# Patient Record
Sex: Male | Born: 1975 | Race: White | Hispanic: No | Marital: Married | Smoking: Never smoker
Health system: Southern US, Community
[De-identification: ages and names within clinical notes are randomized; demographics above are authoritative.]

## PROBLEM LIST (undated history)

## (undated) DIAGNOSIS — J342 Deviated nasal septum: Secondary | ICD-10-CM

## (undated) DIAGNOSIS — L905 Scar conditions and fibrosis of skin: Secondary | ICD-10-CM

## (undated) HISTORY — PX: RHINOPLASTY: SUR1284

## (undated) SURGERY — RECONSTRUCTION, NOSE
Anesthesia: General

---

## 2011-08-23 HISTORY — PX: FACIAL COSMETIC SURGERY: SHX629

## 2015-03-04 ENCOUNTER — Encounter (HOSPITAL_COMMUNITY): Payer: Self-pay | Admitting: Emergency Medicine

## 2015-03-04 ENCOUNTER — Emergency Department (HOSPITAL_COMMUNITY)
Admission: EM | Admit: 2015-03-04 | Discharge: 2015-03-04 | Disposition: A | Discharge status: Home / Self Care | Payer: BLUE CROSS/BLUE SHIELD | Attending: Emergency Medicine | Admitting: Emergency Medicine

## 2015-03-04 DIAGNOSIS — T7809XA Anaphylactic reaction due to other food products, initial encounter: Secondary | ICD-10-CM | POA: Insufficient documentation

## 2015-03-04 DIAGNOSIS — R509 Fever, unspecified: Secondary | ICD-10-CM | POA: Diagnosis not present

## 2015-03-04 DIAGNOSIS — Y998 Other external cause status: Secondary | ICD-10-CM | POA: Insufficient documentation

## 2015-03-04 DIAGNOSIS — X58XXXA Exposure to other specified factors, initial encounter: Secondary | ICD-10-CM | POA: Diagnosis not present

## 2015-03-04 DIAGNOSIS — R21 Rash and other nonspecific skin eruption: Secondary | ICD-10-CM | POA: Diagnosis present

## 2015-03-04 DIAGNOSIS — Y9389 Activity, other specified: Secondary | ICD-10-CM | POA: Diagnosis not present

## 2015-03-04 DIAGNOSIS — Y9289 Other specified places as the place of occurrence of the external cause: Secondary | ICD-10-CM | POA: Diagnosis not present

## 2015-03-04 DIAGNOSIS — T782XXA Anaphylactic shock, unspecified, initial encounter: Secondary | ICD-10-CM

## 2015-03-04 DIAGNOSIS — R197 Diarrhea, unspecified: Secondary | ICD-10-CM

## 2015-03-04 LAB — URINALYSIS, ROUTINE W REFLEX MICROSCOPIC
BILIRUBIN URINE: NEGATIVE
Glucose, UA: NEGATIVE mg/dL
Hgb urine dipstick: NEGATIVE
Ketones, ur: NEGATIVE mg/dL
LEUKOCYTES UA: NEGATIVE
NITRITE: NEGATIVE
PROTEIN: NEGATIVE mg/dL
SPECIFIC GRAVITY, URINE: 1.027 (ref 1.005–1.030)
Urobilinogen, UA: 0.2 mg/dL (ref 0.0–1.0)
pH: 6 (ref 5.0–8.0)

## 2015-03-04 LAB — COMPREHENSIVE METABOLIC PANEL
ALK PHOS: 65 U/L (ref 38–126)
ALT: 29 U/L (ref 17–63)
AST: 31 U/L (ref 15–41)
Albumin: 3.6 g/dL (ref 3.5–5.0)
Anion gap: 11 (ref 5–15)
BUN: 15 mg/dL (ref 6–20)
CO2: 21 mmol/L — ABNORMAL LOW (ref 22–32)
Calcium: 9.1 mg/dL (ref 8.9–10.3)
Chloride: 103 mmol/L (ref 101–111)
Creatinine, Ser: 1.25 mg/dL — ABNORMAL HIGH (ref 0.61–1.24)
GFR calc non Af Amer: >60 mL/min (ref >60)
Glucose, Bld: 188 mg/dL — ABNORMAL HIGH (ref 65–99)
POTASSIUM: 3.5 mmol/L (ref 3.5–5.1)
Sodium: 135 mmol/L (ref 135–145)
Total Bilirubin: 0.6 mg/dL (ref 0.3–1.2)
Total Protein: 7.1 g/dL (ref 6.5–8.1)

## 2015-03-04 LAB — CBC WITH DIFFERENTIAL/PLATELET
BASOS ABS: 0 10*3/uL (ref 0.0–0.1)
Basophils Relative: 0 % (ref 0–1)
EOS ABS: 0 10*3/uL (ref 0.0–0.7)
Eosinophils Relative: 0 % (ref 0–5)
HCT: 42.7 % (ref 39.0–52.0)
Hemoglobin: 15.2 g/dL (ref 13.0–17.0)
LYMPHS ABS: 1.9 10*3/uL (ref 0.7–4.0)
Lymphocytes Relative: 15 % (ref 12–46)
MCH: 29.3 pg (ref 26.0–34.0)
MCHC: 35.6 g/dL (ref 30.0–36.0)
MCV: 82.4 fL (ref 78.0–100.0)
MONO ABS: 0.3 10*3/uL (ref 0.1–1.0)
Monocytes Relative: 3 % (ref 3–12)
Neutro Abs: 10.7 10*3/uL — ABNORMAL HIGH (ref 1.7–7.7)
Neutrophils Relative %: 82 % — ABNORMAL HIGH (ref 43–77)
Platelets: 197 10*3/uL (ref 150–400)
RBC: 5.18 MIL/uL (ref 4.22–5.81)
RDW: 13.1 % (ref 11.5–15.5)
WBC: 12.9 10*3/uL — ABNORMAL HIGH (ref 4.0–10.5)

## 2015-03-04 LAB — I-STAT CG4 LACTIC ACID, ED
LACTIC ACID, VENOUS: 1.06 mmol/L (ref 0.5–2.0)
Lactic Acid, Venous: 3.31 mmol/L (ref 0.5–2.0)

## 2015-03-04 MED ORDER — EPINEPHRINE HCL 0.1 MG/ML IJ SOSY
0.3000 mg | PREFILLED_SYRINGE | Freq: Once | INTRAMUSCULAR | Status: DC
  Filled 2015-03-04: qty 10

## 2015-03-04 MED ORDER — RANITIDINE HCL 150 MG/10ML PO SYRP
150.0000 mg | ORAL_SOLUTION | Freq: Once | ORAL | Status: AC
  Administered 2015-03-04: 150 mg via ORAL
  Filled 2015-03-04: qty 10

## 2015-03-04 MED ORDER — PREDNISONE 20 MG PO TABS
60.0000 mg | ORAL_TABLET | Freq: Once | ORAL | Status: AC
  Administered 2015-03-04: 60 mg via ORAL
  Filled 2015-03-04: qty 3

## 2015-03-04 MED ORDER — EPINEPHRINE 0.3 MG/0.3ML IJ SOAJ: 0.3000 mg | Freq: Once | INTRAMUSCULAR | Status: DC

## 2015-03-04 MED ORDER — HYDROXYZINE HCL 25 MG PO TABS
25.0000 mg | ORAL_TABLET | Freq: Once | ORAL | Status: DC
  Filled 2015-03-04: qty 1

## 2015-03-04 MED ORDER — HYDROXYZINE HCL 25 MG PO TABS: 25.0000 mg | ORAL_TABLET | Freq: Four times a day (QID) | ORAL | Status: DC

## 2015-03-04 MED ORDER — SODIUM CHLORIDE 0.9 % IV BOLUS (SEPSIS)
1000.0000 mL | Freq: Once | INTRAVENOUS | Status: AC
  Administered 2015-03-04: 1000 mL via INTRAVENOUS

## 2015-03-04 MED ORDER — PREDNISONE 10 MG PO TABS: 40.0000 mg | ORAL_TABLET | Freq: Every day | ORAL | Status: AC

## 2015-03-04 MED ORDER — DIPHENHYDRAMINE HCL 50 MG/ML IJ SOLN
50.0000 mg | Freq: Once | INTRAMUSCULAR | Status: AC
  Administered 2015-03-04: 50 mg via INTRAVENOUS
  Filled 2015-03-04: qty 1

## 2015-03-04 MED ORDER — EPINEPHRINE 0.3 MG/0.3ML IJ SOAJ
0.3000 mg | Freq: Once | INTRAMUSCULAR | Status: AC
  Administered 2015-03-04: 0.3 mg via INTRAMUSCULAR
  Filled 2015-03-04: qty 0.3

## 2015-03-04 NOTE — ED Notes (Signed)
MD at bedside. 

## 2015-03-04 NOTE — ED Notes (Signed)
MD and this RN at bedside, starting IV, tech obtaining vs

## 2015-03-04 NOTE — ED Notes (Signed)
Dr. Micheline Mazeocherty notified of lactic acid

## 2015-03-04 NOTE — Discharge Instructions (Signed)
Anaphylactic Reaction °An anaphylactic reaction is a sudden, severe allergic reaction that involves the whole body. It can be life threatening. A hospital stay is often required. People with asthma, eczema, or hay fever are slightly more likely to have an anaphylactic reaction. °CAUSES  °An anaphylactic reaction may be caused by anything to which you are allergic. After being exposed to the allergic substance, your immune system becomes sensitized to it. When you are exposed to that allergic substance again, an allergic reaction can occur. Common causes of an anaphylactic reaction include: °· Medicines. °· Foods, especially peanuts, wheat, shellfish, milk, and eggs. °· Insect bites or stings. °· Blood products. °· Chemicals, such as dyes, latex, and contrast material used for imaging tests. °SYMPTOMS  °When an allergic reaction occurs, the body releases histamine and other substances. These substances cause symptoms such as tightening of the airway. Symptoms often develop within seconds or minutes of exposure. Symptoms may include: °· Skin rash or hives. °· Itching. °· Chest tightness. °· Swelling of the eyes, tongue, or lips. °· Trouble breathing or swallowing. °· Lightheadedness or fainting. °· Anxiety or confusion. °· Stomach pains, vomiting, or diarrhea. °· Nasal congestion. °· A fast or irregular heartbeat (palpitations). °DIAGNOSIS  °Diagnosis is based on your history of recent exposure to allergic substances, your symptoms, and a physical exam. Your caregiver may also perform blood or urine tests to confirm the diagnosis. °TREATMENT  °Epinephrine medicine is the main treatment for an anaphylactic reaction. Other medicines that may be used for treatment include antihistamines, steroids, and albuterol. In severe cases, fluids and medicine to support blood pressure may be given through an intravenous line (IV). Even if you improve after treatment, you need to be observed to make sure your condition does not get  worse. This may require a stay in the hospital. °HOME CARE INSTRUCTIONS  °· Wear a medical alert bracelet or necklace stating your allergy. °· You and your family must learn how to use an anaphylaxis kit or give an epinephrine injection to temporarily treat an emergency allergic reaction. Always carry your epinephrine injection or anaphylaxis kit with you. This can be lifesaving if you have a severe reaction. °· Do not drive or perform tasks after treatment until the medicines used to treat your reaction have worn off, or until your caregiver says it is okay. °· If you have hives or a rash: °· Take medicines as directed by your caregiver. °· You may use an over-the-counter antihistamine (diphenhydramine) as needed. °· Apply cold compresses to the skin or take baths in cool water. Avoid hot baths or showers. °SEEK MEDICAL CARE IF:  °· You develop symptoms of an allergic reaction to a new substance. Symptoms may start right away or minutes later. °· You develop a rash, hives, or itching. °· You develop new symptoms. °SEEK IMMEDIATE MEDICAL CARE IF:  °· You have swelling of the mouth, difficulty breathing, or wheezing. °· You have a tight feeling in your chest or throat. °· You develop hives, swelling, or itching all over your body. °· You develop severe vomiting or diarrhea. °· You feel faint or pass out. °This is an emergency. Use your epinephrine injection or anaphylaxis kit as you have been instructed. Call your local emergency services (911 in [place] Even if you improve after the injection, you need to be examined at a hospital emergency department. °MAKE SURE YOU:  °· Understand these instructions. °· Will watch your condition. °· Will get help right away if you are not   doing well or get worse. °Document Released: 08/08/2005 Document Revised: 08/13/2013 Document Reviewed: 11/09/2011 °ExitCare® Patient Information ©2015 [employer]. This information is not intended to replace advice given to you by your health  care provider. Make sure you discuss any questions you have with your health care provider. ° ° ° ° °Allergies °Allergies may happen from anything your body is sensitive to. This may be food, medicines, pollens, chemicals, and nearly anything around you in everyday life that produces allergens. An allergen is anything that causes an allergy producing substance. Heredity is often a factor in causing these problems. This means you may have some of the same allergies as your parents. °Food allergies happen in all age groups. Food allergies are some of the most severe and life threatening. Some common food allergies are cow's milk, seafood, eggs, nuts, wheat, and soybeans. °SYMPTOMS  °· Swelling around the mouth. °· An itchy red rash or hives. °· Vomiting or diarrhea. °· Difficulty breathing. °SEVERE ALLERGIC REACTIONS ARE LIFE-THREATENING. °This reaction is called anaphylaxis. It can cause the mouth and throat to swell and cause difficulty with breathing and swallowing. In severe reactions only a trace amount of food (for example, peanut oil in a salad) may cause death within seconds. °Seasonal allergies occur in all age groups. These are seasonal because they usually occur during the same season every year. They may be a reaction to molds, grass pollens, or tree pollens. Other causes of problems are house dust mite allergens, pet dander, and mold spores. The symptoms often consist of nasal congestion, a runny itchy nose associated with sneezing, and tearing itchy eyes. There is often an associated itching of the mouth and ears. The problems happen when you come in contact with pollens and other allergens. Allergens are the particles in the air that the body reacts to with an allergic reaction. This causes you to release allergic antibodies. Through a chain of events, these eventually cause you to release histamine into the blood stream. Although it is meant to be protective to the body, it is this release that causes  your discomfort. This is why you were given anti-histamines to feel better.  If you are unable to pinpoint the offending allergen, it may be determined by skin or blood testing. Allergies cannot be cured but can be controlled with medicine. °Hay fever is a collection of all or some of the seasonal allergy problems. It may often be treated with simple over-the-counter medicine such as diphenhydramine. Take medicine as directed. Do not drink alcohol or drive while taking this medicine. Check with your caregiver or package insert for child dosages. °If these medicines are not effective, there are many new medicines your caregiver can prescribe. Stronger medicine such as nasal spray, eye drops, and corticosteroids may be used if the first things you try do not work well. Other treatments such as immunotherapy or desensitizing injections can be used if all else fails. Follow up with your caregiver if problems continue. These seasonal allergies are usually not life threatening. They are generally more of a nuisance that can often be handled using medicine. °HOME CARE INSTRUCTIONS  °· If unsure what causes a reaction, keep a diary of foods eaten and symptoms that follow. Avoid foods that cause reactions. °· If hives or rash are present: °¨ Take medicine as directed. °¨ You may use an over-the-counter antihistamine (diphenhydramine) for hives and itching as needed. °¨ Apply cold compresses (cloths) to the skin or take baths in cool water. Avoid hot baths   or showers. Heat will make a rash and itching worse. °· If you are severely allergic: °¨ Following a treatment for a severe reaction, hospitalization is often required for closer follow-up. °¨ Wear a medic-alert bracelet or necklace stating the allergy. °¨ You and your family must learn how to give adrenaline or use an anaphylaxis kit. °¨ If you have had a severe reaction, always carry your anaphylaxis kit or EpiPen® with you. Use this medicine as directed by your caregiver  if a severe reaction is occurring. Failure to do so could have a fatal outcome. °SEEK MEDICAL CARE IF: °· You suspect a food allergy. Symptoms generally happen within 30 minutes of eating a food. °· Your symptoms have not gone away within 2 days or are getting worse. °· You develop new symptoms. °· You want to retest yourself or your child with a food or drink you think causes an allergic reaction. Never do this if an anaphylactic reaction to that food or drink has happened before. Only do this under the care of a caregiver. °SEEK IMMEDIATE MEDICAL CARE IF:  °· You have difficulty breathing, are wheezing, or have a tight feeling in your chest or throat. °· You have a swollen mouth, or you have hives, swelling, or itching all over your body. °· You have had a severe reaction that has responded to your anaphylaxis kit or an EpiPen®. These reactions may return when the medicine has worn off. These reactions should be considered life threatening. °MAKE SURE YOU:  °· Understand these instructions. °· Will watch your condition. °· Will get help right away if you are not doing well or get worse. °Document Released: 11/01/2002 Document Revised: 12/03/2012 Document Reviewed: 04/07/2008 °ExitCare® Patient Information ©2015 [employer]. This information is not intended to replace advice given to you by your health care provider. Make sure you discuss any questions you have with your health care provider. ° °

## 2015-03-04 NOTE — ED Notes (Signed)
Pt states he is itching more now

## 2015-03-04 NOTE — ED Provider Notes (Signed)
CSN: [id number]     Arrival date & time 03/04/15  [id number] History   First MD Initiated Contact with Patient 03/04/15 [phone]     No chief complaint on file.    (Consider location/radiation/quality/duration/timing/severity/associated sxs/prior Treatment) Patient is a 39 y.o. male presenting with rash.  Rash Location:  Full body Quality: itchiness and redness   Severity:  Severe Onset quality:  Sudden Duration:  3 days Timing:  Constant Progression:  Waxing and waning Chronicity:  New Context: food (potato chips with certain type of oil)   Context: not animal contact, not exposure to similar rash, not insect bite/sting, not medications, not new detergent/soap, not plant contact and not sick contacts   Context comment:  Immunized Relieved by: prednisone and benadryl helped at first from urgent care but symptoms worse today. Associated symptoms: abdominal pain (diffuse and lower), diarrhea, fever (100.6 yesterday) and nausea   Associated symptoms: no headaches, no hoarse voice, no joint pain, no shortness of breath, no sore throat, no throat swelling (felt like it was yesterday, not today) and not vomiting     No past medical history on file. No past surgical history on file. No family history on file. History  Substance Use Topics  . Smoking status: Not on file  . Smokeless tobacco: Not on file  . Alcohol Use: Not on file    Review of Systems  Constitutional: Positive for fever (100.6 yesterday).  HENT: Negative for hoarse voice and sore throat.   Eyes: Negative for visual disturbance.  Respiratory: Negative for shortness of breath.   Cardiovascular: Negative for chest pain.  Gastrointestinal: Positive for nausea, abdominal pain (diffuse and lower) and diarrhea. Negative for vomiting.  Genitourinary: Positive for dysuria. Negative for difficulty urinating.  Musculoskeletal: Negative for back pain, arthralgias and neck stiffness.  Skin: Positive for rash.  Neurological: Negative  for syncope and headaches.      Allergies  Review of patient's allergies indicates not on file.  Home Medications   Prior to Admission medications   Not on File   BP 134/83 mmHg  Pulse 95  Temp(Src) 98.3 F (36.8 C) (Oral)  Resp 18  Ht 5\' 11"  (1.803 m)  Wt 194 lb (87.998 kg)  BMI 27.07 kg/m2  SpO2 100% Physical Exam  Constitutional: He is oriented to person, place, and time. He appears well-developed and well-nourished. No distress.  HENT:  Head: Normocephalic and atraumatic.  Mouth/Throat: Oropharynx is clear and moist. No oropharyngeal exudate (no oral/mucous membrane lesions).  Eyes: Conjunctivae and EOM are normal. Pupils are equal, round, and reactive to light.  Normal conjunctiva   Neck: Normal range of motion.  Cardiovascular: Normal rate, regular rhythm, normal heart sounds and intact distal pulses.  Exam reveals no gallop and no friction rub.   No murmur heard. Pulmonary/Chest: Effort normal and breath sounds normal. No respiratory distress. He has no wheezes. He has no rales.  Abdominal: Soft. He exhibits no distension. There is no tenderness. There is no guarding.  Musculoskeletal: He exhibits no edema.  Neurological: He is alert and oriented to person, place, and time.  Skin: Skin is warm and dry. Rash (diffuse urticaria, various sizes) noted. He is not diaphoretic.  Nursing note and vitals reviewed.   ED Course  Procedures (including critical care time) Labs Review Labs Reviewed  CBC WITH DIFFERENTIAL/PLATELET  COMPREHENSIVE METABOLIC PANEL  URINALYSIS, ROUTINE W REFLEX MICROSCOPIC (NOT AT [facility])  I-STAT CG4 LACTIC ACID, ED    Imaging Review No results found.  EKG Interpretation None      MDM   Final diagnoses:  None   39 year old male with no significant medical history presents with concern of pruritic rash that developed suddenly everywhere after eating bag of potato chips. Patient reports that a rash started on Sunday and he went to  urgent care where he received some type of injection, prednisone and Benadryl. Reports that the rash has been waxing and waning since that time however became worse again today and he developed diarrhea, abdominal pain and nausea. Reports a sensation of throat swelling yesterday that has now resolved however he did not seek care at that time. Rash does not have the appearance of SSS, TEN, erythroderma, scabies, cellulitis or RMSF.  Given description of waxing/waning lesions developing in different locations and sudden appearance of pruritic rash consistent with urticaria.  Given combination of rash with development of GI symptoms, patient was treated for anaphylaxis with 0.3 mg of IM epinephrine, Benadryl, steroid-dependent and Zantac.  He was observed in the emergency department for 6 hours without recurrence of his symptoms.  Patient had great improvement of GI symptoms immediately following epinephrine and improvement in the rash.  Patient had also described a fever yesterday however is afebrile today without receiving anti-pyretics prior to arrival. Other etiologies of patient's symptoms include possible viral syndrome with urticaria.  Given concern for possible anaphylaxis patient will be discharged with an EpiPen as well as prednisone and atarax for pruritis.  Discussed need to look for potential triggers, avoid the potato chips.  Follow up closely with PCP.     Alvira Monday, MD 03/04/15 [phone]

## 2015-03-04 NOTE — ED Notes (Signed)
Hives, itching, rash since July 11th.  Abd pain and diarrea as well since that time, dysuria too. MD at bedside now sob today, no difficulty breathing.  vss

## 2015-07-23 DIAGNOSIS — L905 Scar conditions and fibrosis of skin: Secondary | ICD-10-CM

## 2015-07-23 DIAGNOSIS — J342 Deviated nasal septum: Secondary | ICD-10-CM

## 2015-07-23 HISTORY — DX: Deviated nasal septum: J34.2

## 2015-07-23 HISTORY — DX: Scar conditions and fibrosis of skin: L90.5

## 2015-08-03 ENCOUNTER — Encounter (HOSPITAL_BASED_OUTPATIENT_CLINIC_OR_DEPARTMENT_OTHER): Payer: Self-pay | Admitting: *Deleted

## 2015-08-03 ENCOUNTER — Ambulatory Visit: Payer: Self-pay | Admitting: Specialist

## 2015-08-03 ENCOUNTER — Other Ambulatory Visit: Payer: Self-pay | Admitting: Specialist

## 2015-08-06 ENCOUNTER — Other Ambulatory Visit: Payer: Self-pay | Admitting: Specialist

## 2015-08-06 ENCOUNTER — Ambulatory Visit: Payer: Self-pay | Admitting: Specialist

## 2015-08-06 MED ORDER — DEXTROSE 5 % IV SOLN: 2.0000 g | INTRAVENOUS | Status: AC

## 2015-08-10 ENCOUNTER — Encounter (HOSPITAL_BASED_OUTPATIENT_CLINIC_OR_DEPARTMENT_OTHER)
Admission: RE | Disposition: A | Discharge status: Home / Self Care | Payer: Self-pay | Source: Ambulatory Visit | Attending: Specialist

## 2015-08-10 ENCOUNTER — Ambulatory Visit (HOSPITAL_BASED_OUTPATIENT_CLINIC_OR_DEPARTMENT_OTHER): Admission: RE | Admit: 2015-08-10 | Payer: BLUE CROSS/BLUE SHIELD | Source: Ambulatory Visit | Admitting: Specialist

## 2015-08-10 ENCOUNTER — Encounter (HOSPITAL_BASED_OUTPATIENT_CLINIC_OR_DEPARTMENT_OTHER): Payer: Self-pay | Admitting: *Deleted

## 2015-08-10 ENCOUNTER — Ambulatory Visit (HOSPITAL_BASED_OUTPATIENT_CLINIC_OR_DEPARTMENT_OTHER): Payer: BLUE CROSS/BLUE SHIELD | Admitting: Anesthesiology

## 2015-08-10 ENCOUNTER — Ambulatory Visit (HOSPITAL_BASED_OUTPATIENT_CLINIC_OR_DEPARTMENT_OTHER)
Admission: RE | Admit: 2015-08-10 | Discharge: 2015-08-10 | Disposition: A | Discharge status: Home / Self Care | Payer: BLUE CROSS/BLUE SHIELD | Source: Ambulatory Visit | Attending: Specialist | Admitting: Specialist

## 2015-08-10 DIAGNOSIS — L905 Scar conditions and fibrosis of skin: Secondary | ICD-10-CM | POA: Diagnosis not present

## 2015-08-10 DIAGNOSIS — J342 Deviated nasal septum: Secondary | ICD-10-CM | POA: Diagnosis not present

## 2015-08-10 DIAGNOSIS — J343 Hypertrophy of nasal turbinates: Secondary | ICD-10-CM | POA: Insufficient documentation

## 2015-08-10 HISTORY — DX: Scar conditions and fibrosis of skin: L90.5

## 2015-08-10 HISTORY — DX: Deviated nasal septum: J34.2

## 2015-08-10 HISTORY — PX: SCAR REVISION: SHX5285

## 2015-08-10 HISTORY — PX: SEPTOPLASTY: SHX2393

## 2015-08-10 SURGERY — SEPTOPLASTY, NOSE
Anesthesia: General | Site: Nose | Laterality: Bilateral

## 2015-08-10 SURGERY — RECONSTRUCTION, NOSE
Anesthesia: General

## 2015-08-10 MED ORDER — MEPERIDINE HCL 25 MG/ML IJ SOLN: 6.2500 mg | INTRAMUSCULAR | Status: DC | PRN

## 2015-08-10 MED ORDER — ONDANSETRON 8 MG PO TBDP
8.0000 mg | ORAL_TABLET | Freq: Once | ORAL | Status: AC
  Administered 2015-08-10: 8 mg via ORAL

## 2015-08-10 MED ORDER — DEXAMETHASONE SODIUM PHOSPHATE 4 MG/ML IJ SOLN
INTRAMUSCULAR | Status: DC | PRN
  Administered 2015-08-10: 10 mg via INTRAVENOUS

## 2015-08-10 MED ORDER — MIDAZOLAM HCL 5 MG/5ML IJ SOLN
INTRAMUSCULAR | Status: DC | PRN
  Administered 2015-08-10: 2 mg via INTRAVENOUS

## 2015-08-10 MED ORDER — LIDOCAINE-EPINEPHRINE 1 %-1:100000 IJ SOLN
INTRAMUSCULAR | Status: AC
  Filled 2015-08-10: qty 1

## 2015-08-10 MED ORDER — PROPOFOL 500 MG/50ML IV EMUL
INTRAVENOUS | Status: AC
  Filled 2015-08-10: qty 50

## 2015-08-10 MED ORDER — LACTATED RINGERS IV SOLN
INTRAVENOUS | Status: DC
  Administered 2015-08-10: 07:00:00 via INTRAVENOUS

## 2015-08-10 MED ORDER — OXYCODONE HCL 5 MG PO TABS
ORAL_TABLET | ORAL | Status: AC
  Filled 2015-08-10: qty 1

## 2015-08-10 MED ORDER — BSS IO SOLN
INTRAOCULAR | Status: AC
  Filled 2015-08-10: qty 15

## 2015-08-10 MED ORDER — HYDROMORPHONE HCL 1 MG/ML IJ SOLN
INTRAMUSCULAR | Status: AC
  Filled 2015-08-10: qty 1

## 2015-08-10 MED ORDER — ONDANSETRON 8 MG PO TBDP
ORAL_TABLET | ORAL | Status: AC
  Filled 2015-08-10: qty 1

## 2015-08-10 MED ORDER — FENTANYL CITRATE (PF) 100 MCG/2ML IJ SOLN
INTRAMUSCULAR | Status: DC | PRN
  Administered 2015-08-10: 100 ug via INTRAVENOUS

## 2015-08-10 MED ORDER — CEFAZOLIN SODIUM-DEXTROSE 2-3 GM-% IV SOLR
2.0000 g | INTRAVENOUS | Status: AC
  Administered 2015-08-10: 2 g via INTRAVENOUS

## 2015-08-10 MED ORDER — BACITRACIN ZINC 500 UNIT/GM EX OINT
TOPICAL_OINTMENT | CUTANEOUS | Status: AC
  Filled 2015-08-10: qty 3.6

## 2015-08-10 MED ORDER — SCOPOLAMINE 1 MG/3DAYS TD PT72
1.0000 | MEDICATED_PATCH | Freq: Once | TRANSDERMAL | Status: DC
  Administered 2015-08-10: 1.5 mg via TRANSDERMAL

## 2015-08-10 MED ORDER — COCAINE HCL 4 % EX SOLN
CUTANEOUS | Status: DC | PRN
  Administered 2015-08-10: 4 mL via NASAL

## 2015-08-10 MED ORDER — GLYCOPYRROLATE 0.2 MG/ML IJ SOLN: 0.2000 mg | Freq: Once | INTRAMUSCULAR | Status: DC | PRN

## 2015-08-10 MED ORDER — OXYMETAZOLINE HCL 0.05 % NA SOLN
NASAL | Status: AC
  Filled 2015-08-10: qty 15

## 2015-08-10 MED ORDER — COCAINE HCL 4 % EX SOLN
CUTANEOUS | Status: AC
  Filled 2015-08-10: qty 4

## 2015-08-10 MED ORDER — MIDAZOLAM HCL 2 MG/2ML IJ SOLN
INTRAMUSCULAR | Status: AC
  Filled 2015-08-10: qty 2

## 2015-08-10 MED ORDER — ONDANSETRON HCL 4 MG/2ML IJ SOLN
INTRAMUSCULAR | Status: AC
  Filled 2015-08-10: qty 2

## 2015-08-10 MED ORDER — EPHEDRINE SULFATE 50 MG/ML IJ SOLN
INTRAMUSCULAR | Status: AC
  Filled 2015-08-10: qty 1

## 2015-08-10 MED ORDER — LIDOCAINE HCL (CARDIAC) 20 MG/ML IV SOLN
INTRAVENOUS | Status: AC
  Filled 2015-08-10: qty 5

## 2015-08-10 MED ORDER — OXYCODONE HCL 5 MG PO TABS
5.0000 mg | ORAL_TABLET | Freq: Once | ORAL | Status: AC | PRN
  Administered 2015-08-10: 5 mg via ORAL

## 2015-08-10 MED ORDER — LIDOCAINE-EPINEPHRINE 0.5 %-1:200000 IJ SOLN
INTRAMUSCULAR | Status: AC
  Filled 2015-08-10: qty 2

## 2015-08-10 MED ORDER — SUCCINYLCHOLINE CHLORIDE 20 MG/ML IJ SOLN
INTRAMUSCULAR | Status: DC | PRN
  Administered 2015-08-10: 100 mg via INTRAVENOUS

## 2015-08-10 MED ORDER — PROMETHAZINE HCL 25 MG/ML IJ SOLN: 6.2500 mg | Freq: Once | INTRAMUSCULAR | Status: DC | PRN

## 2015-08-10 MED ORDER — FENTANYL CITRATE (PF) 100 MCG/2ML IJ SOLN
INTRAMUSCULAR | Status: AC
  Filled 2015-08-10: qty 2

## 2015-08-10 MED ORDER — SUCCINYLCHOLINE CHLORIDE 20 MG/ML IJ SOLN
INTRAMUSCULAR | Status: AC
  Filled 2015-08-10: qty 1

## 2015-08-10 MED ORDER — SCOPOLAMINE 1 MG/3DAYS TD PT72
MEDICATED_PATCH | TRANSDERMAL | Status: AC
  Filled 2015-08-10: qty 1

## 2015-08-10 MED ORDER — LIDOCAINE HCL (CARDIAC) 20 MG/ML IV SOLN
INTRAVENOUS | Status: DC | PRN
  Administered 2015-08-10: 50 mg via INTRAVENOUS

## 2015-08-10 MED ORDER — ONDANSETRON HCL 4 MG/2ML IJ SOLN
INTRAMUSCULAR | Status: DC | PRN
  Administered 2015-08-10: 4 mg via INTRAVENOUS

## 2015-08-10 MED ORDER — BACITRACIN-NEOMYCIN-POLYMYXIN 400-5-5000 EX OINT
TOPICAL_OINTMENT | CUTANEOUS | Status: AC
  Filled 2015-08-10: qty 1

## 2015-08-10 MED ORDER — DEXAMETHASONE SODIUM PHOSPHATE 10 MG/ML IJ SOLN
INTRAMUSCULAR | Status: AC
  Filled 2015-08-10: qty 1

## 2015-08-10 MED ORDER — HYDROMORPHONE HCL 1 MG/ML IJ SOLN
0.2500 mg | INTRAMUSCULAR | Status: DC | PRN
  Administered 2015-08-10: 0.25 mg via INTRAVENOUS
  Administered 2015-08-10 (×2): 0.5 mg via INTRAVENOUS

## 2015-08-10 MED ORDER — OXYCODONE HCL 5 MG/5ML PO SOLN: 5.0000 mg | Freq: Once | ORAL | Status: AC | PRN

## 2015-08-10 MED ORDER — LIDOCAINE-EPINEPHRINE 1 %-1:100000 IJ SOLN
INTRAMUSCULAR | Status: DC | PRN
  Administered 2015-08-10: 19 mL

## 2015-08-10 MED ORDER — PROPOFOL 10 MG/ML IV BOLUS
INTRAVENOUS | Status: DC | PRN
  Administered 2015-08-10: 300 mg via INTRAVENOUS

## 2015-08-10 MED ORDER — EPHEDRINE SULFATE 50 MG/ML IJ SOLN
INTRAMUSCULAR | Status: DC | PRN
  Administered 2015-08-10: 10 mg via INTRAVENOUS

## 2015-08-10 MED ORDER — CEFAZOLIN SODIUM-DEXTROSE 2-3 GM-% IV SOLR
INTRAVENOUS | Status: AC
  Filled 2015-08-10: qty 50

## 2015-08-10 SURGICAL SUPPLY — 72 items
BAG DECANTER FOR FLEXI CONT (MISCELLANEOUS) IMPLANT
BANDAGE ELASTIC 4 VELCRO ST LF (GAUZE/BANDAGES/DRESSINGS) IMPLANT
BANDAGE EYE OVAL (MISCELLANEOUS) ×3 IMPLANT
BENZOIN TINCTURE PRP APPL 2/3 (GAUZE/BANDAGES/DRESSINGS) ×3 IMPLANT
BLADE KNIFE PERSONA 15 (BLADE) ×3 IMPLANT
BNDG COHESIVE 4X5 TAN STRL (GAUZE/BANDAGES/DRESSINGS) IMPLANT
BNDG GAUZE ELAST 4 BULKY (GAUZE/BANDAGES/DRESSINGS) IMPLANT
CANISTER SUCT 1200ML W/VALVE (MISCELLANEOUS) ×3 IMPLANT
COAGULATOR SUCT 8FR VV (MISCELLANEOUS) IMPLANT
COVER BACK TABLE 60X90IN (DRAPES) ×3 IMPLANT
COVER MAYO STAND STRL (DRAPES) ×3 IMPLANT
DECANTER SPIKE VIAL GLASS SM (MISCELLANEOUS) ×3 IMPLANT
DRAPE LAPAROTOMY 100X72 PEDS (DRAPES) IMPLANT
DRAPE LAPAROTOMY TRNSV 102X78 (DRAPE) IMPLANT
DRAPE U-SHAPE 76X120 STRL (DRAPES) ×3 IMPLANT
DRSG PAD ABDOMINAL 8X10 ST (GAUZE/BANDAGES/DRESSINGS) IMPLANT
ELECT NEEDLE TIP 2.8 STRL (NEEDLE) ×3 IMPLANT
ELECT REM PT RETURN 9FT ADLT (ELECTROSURGICAL) ×3
ELECTRODE REM PT RTRN 9FT ADLT (ELECTROSURGICAL) ×1 IMPLANT
FILTER 7/8 IN (FILTER) IMPLANT
GAUZE PACKING 1/2X5YD (GAUZE/BANDAGES/DRESSINGS) ×3 IMPLANT
GAUZE SPONGE 4X4 12PLY STRL (GAUZE/BANDAGES/DRESSINGS) IMPLANT
GAUZE SPONGE 4X4 16PLY XRAY LF (GAUZE/BANDAGES/DRESSINGS) IMPLANT
GAUZE VASELINE FOILPK 1/2 X 72 (GAUZE/BANDAGES/DRESSINGS) ×3 IMPLANT
GAUZE XEROFORM 1X8 LF (GAUZE/BANDAGES/DRESSINGS) IMPLANT
GAUZE XEROFORM 5X9 LF (GAUZE/BANDAGES/DRESSINGS) IMPLANT
GLOVE BIO SURGEON STRL SZ 6.5 (GLOVE) ×4 IMPLANT
GLOVE BIO SURGEONS STRL SZ 6.5 (GLOVE) ×2
GLOVE BIOGEL M STRL SZ7.5 (GLOVE) IMPLANT
GLOVE BIOGEL PI IND STRL 8 (GLOVE) IMPLANT
GLOVE BIOGEL PI INDICATOR 8 (GLOVE)
GLOVE ECLIPSE 7.0 STRL STRAW (GLOVE) ×3 IMPLANT
GOWN STRL REUS W/ TWL LRG LVL3 (GOWN DISPOSABLE) IMPLANT
GOWN STRL REUS W/ TWL XL LVL3 (GOWN DISPOSABLE) ×1 IMPLANT
GOWN STRL REUS W/TWL LRG LVL3 (GOWN DISPOSABLE)
GOWN STRL REUS W/TWL XL LVL3 (GOWN DISPOSABLE) ×2
IV NS 500ML (IV SOLUTION) ×2
IV NS 500ML BAXH (IV SOLUTION) ×1 IMPLANT
MARKER SKIN DUAL TIP RULER LAB (MISCELLANEOUS) IMPLANT
NEEDLE HYPO 25X1 1.5 SAFETY (NEEDLE) ×3 IMPLANT
NEEDLE SPNL 18GX3.5 QUINCKE PK (NEEDLE) IMPLANT
PACK BASIN DAY SURGERY FS (CUSTOM PROCEDURE TRAY) ×3 IMPLANT
PACK ENT DAY SURGERY (CUSTOM PROCEDURE TRAY) ×3 IMPLANT
PEN SKIN MARKING BROAD TIP (MISCELLANEOUS) ×3 IMPLANT
SHEET MEDIUM DRAPE 40X70 STRL (DRAPES) IMPLANT
SHEETING SILICONE GEL EPI DERM (MISCELLANEOUS) IMPLANT
SLEEVE SCD COMPRESS KNEE MED (MISCELLANEOUS) ×3 IMPLANT
SPONGE LAP 18X18 X RAY DECT (DISPOSABLE) ×3 IMPLANT
STOCKINETTE IMPERVIOUS LG (DRAPES) IMPLANT
STRIP SUTURE WOUND CLOSURE 1/2 (SUTURE) IMPLANT
SUT ETHILON 5 0 PS 2 18 (SUTURE) ×3 IMPLANT
SUT FIBERWIRE 3-0 18 DIAM 3/8 (SUTURE)
SUT MNCRL AB 3-0 PS2 18 (SUTURE) ×3 IMPLANT
SUT MON AB 2-0 CT1 36 (SUTURE) IMPLANT
SUT MON AB 4-0 PC3 18 (SUTURE) IMPLANT
SUT MON AB 5-0 PS2 18 (SUTURE) ×3 IMPLANT
SUT PROLENE 4 0 PS 2 18 (SUTURE) IMPLANT
SUT PROLENE 5 0 P 3 (SUTURE) IMPLANT
SUT PROLENE 5 0 PS 2 (SUTURE) IMPLANT
SUT PROLENE 6 0 P 1 18 (SUTURE) IMPLANT
SUTURE FIBERWR 3-0 18 DIAM 3/8 (SUTURE) IMPLANT
SYR 20CC LL (SYRINGE) IMPLANT
SYR 50ML LL SCALE MARK (SYRINGE) IMPLANT
SYR BULB IRRIGATION 50ML (SYRINGE) IMPLANT
SYR CONTROL 10ML LL (SYRINGE) IMPLANT
TAPE PAPER 1X10 WHT MICROPORE (GAUZE/BANDAGES/DRESSINGS) IMPLANT
TOWEL OR 17X24 6PK STRL BLUE (TOWEL DISPOSABLE) ×6 IMPLANT
TUBE CONNECTING 20'X1/4 (TUBING)
TUBE CONNECTING 20X1/4 (TUBING) IMPLANT
UNDERPAD 30X30 (UNDERPADS AND DIAPERS) ×3 IMPLANT
VAC PENCILS W/TUBING CLEAR (MISCELLANEOUS) ×3 IMPLANT
YANKAUER SUCT BULB TIP NO VENT (SUCTIONS) IMPLANT

## 2015-08-10 NOTE — Transfer of Care (Signed)
Immediate Anesthesia Transfer of Care Note  Patient: Kejuan Hogenson  Procedure(s) Performed: Procedure(s): RECONSTRUCTION RHINO SEPTOPLASTY CARTILAGE GRAFT REVISED BILATERAL INFERIOR TURBINaTES SCAR REVISION LIP (Bilateral) SCAR REVISION LIP (Bilateral)  Patient Location: PACU  Anesthesia Type:General  Level of Consciousness: awake and sedated  Airway & Oxygen Therapy: Patient Spontanous Breathing and Patient connected to face mask oxygen  Post-op Assessment: Report given to RN and Post -op Vital signs reviewed and stable  Post vital signs: Reviewed and stable  Last Vitals:  Filed Vitals:   08/10/15 0642  BP: 116/86  Pulse: 63  Temp: 36.1 C  Resp: 20    Complications: No apparent anesthesia complications

## 2015-08-10 NOTE — Anesthesia Preprocedure Evaluation (Signed)

## 2015-08-10 NOTE — Brief Op Note (Signed)
08/10/2015  9:09 AM  PATIENT:  Deaire Brunette  39 y.o. male  PRE-OPERATIVE DIAGNOSIS:  DEVIATED SEPTUM  POST-OPERATIVE DIAGNOSIS:  DEVIATED SEPTUM  PROCEDURE:  Procedure(s): RECONSTRUCTION RHINO SEPTOPLASTY CARTILAGE GRAFT REVISED BILATERAL INFERIOR TURBINaTES SCAR REVISION LIP (Bilateral) SCAR REVISION LIP (Bilateral)  SURGEON:  Surgeon(s) and Role:    * Louisa SecondGerald Icyss Skog, MD - Primary  PHYSICIAN ASSISTANT:   ASSISTANTS: none   ANESTHESIA:   general  EBL:  Total I/O In: 1500 [I.V.:1500] Out: -   BLOOD ADMINISTERED:none  DRAINS: none   LOCAL MEDICATIONS USED:  LIDOCAINE   SPECIMEN:  Excision  DISPOSITION OF SPECIMEN:  PATHOLOGY  COUNTS:  YES  TOURNIQUET:  * No tourniquets in log *  DICTATION: .Other Dictation: Dictation Number [id number]  PLAN OF CARE: Discharge to home after PACU  PATIENT DISPOSITION:  PACU - hemodynamically stable.   Delay start of Pharmacological VTE agent (>24hrs) due to surgical blood loss or risk of bleeding: yes

## 2015-08-10 NOTE — Anesthesia Procedure Notes (Signed)
Procedure Name: Intubation Performed by: York GricePEARSON, Anthony Meinders W Pre-anesthesia Checklist: Patient identified, Emergency Drugs available, Suction available and Patient being monitored Patient Re-evaluated:Patient Re-evaluated prior to inductionOxygen Delivery Method: Circle System Utilized Preoxygenation: Pre-oxygenation with 100% oxygen Intubation Type: IV induction Ventilation: Mask ventilation without difficulty Laryngoscope Size: Miller and 2 Grade View: Grade I Tube type: Oral Number of attempts: 1 Airway Equipment and Method: Stylet and Oral airway Placement Confirmation: ETT inserted through vocal cords under direct vision,  positive ETCO2 and breath sounds checked- equal and bilateral Secured at: 22 cm Tube secured with: Tape Dental Injury: Teeth and Oropharynx as per pre-operative assessment

## 2015-08-10 NOTE — Discharge Instructions (Signed)
Activity (include date of return to work if known) °As tolerated: NO showers °NO driving °No heavy activities ° °Diet:regular No restrictions: ° °Wound Care: Keep dressing clean & dry ° °Do not change dressings °For Abdominoplasties wear abdominal binder °Special Instructions: °Do not raise arms up °Continue to empty, recharge, & record drainage from J-P drains &/or °Hemovacs 2-3 times a day, as needed. °Call Doctor if any unusual problems occur such as pain, excessive °Bleeding, unrelieved Nausea/vomiting, Fever &/or chills °When lying down, keep head elevated on 2-3 pillows or back-rest °For Addominoplasties the Jack-knife position °Follow-up appointment: Please call the office. ° °The patient received discharge instruction from:___________________________________________ ° ° °Patient signature ________________________________________ / Date___________ ° ° ° °Signature of individual providing instructions/ Date________________     ° ° °Post Anesthesia Home Care Instructions ° °Activity: °Get plenty of rest for the remainder of the day. A responsible adult should stay with you for 24 hours following the procedure.  °For the next 24 hours, DO NOT: °-Drive a car °-Operate machinery °-Drink alcoholic beverages °-Take any medication unless instructed by your physician °-Make any legal decisions or sign important papers. ° °Meals: °Start with liquid foods such as gelatin or soup. Progress to regular foods as tolerated. Avoid greasy, spicy, heavy foods. If nausea and/or vomiting occur, drink only clear liquids until the nausea and/or vomiting subsides. Call your physician if vomiting continues. ° °Special Instructions/Symptoms: °Your throat may feel dry or sore from the anesthesia or the breathing tube placed in your throat during surgery. If this causes discomfort, gargle with warm salt water. The discomfort should disappear within 24 hours. ° °If you had a scopolamine patch placed behind your ear for the management of  post- operative nausea and/or vomiting: ° °1. The medication in the patch is effective for 72 hours, after which it should be removed.  Wrap patch in a tissue and discard in the trash. Wash hands thoroughly with soap and water. °2. You may remove the patch earlier than 72 hours if you experience unpleasant side effects which may include dry mouth, dizziness or visual disturbances. °3. Avoid touching the patch. Wash your hands with soap and water after contact with the patch. °  °         °

## 2015-08-10 NOTE — Anesthesia Postprocedure Evaluation (Signed)
Anesthesia Post Note  Patient: Anthony Klein  Procedure(s) Performed: Procedure(s) (LRB): RECONSTRUCTION RHINO SEPTOPLASTY CARTILAGE GRAFT REVISED BILATERAL INFERIOR TURBINaTES SCAR REVISION LIP (Bilateral) SCAR REVISION LIP (Bilateral)  Patient location during evaluation: PACU Anesthesia Type: General Level of consciousness: awake and alert Pain management: pain level controlled Vital Signs Assessment: post-procedure vital signs reviewed and stable Respiratory status: spontaneous breathing, nonlabored ventilation, respiratory function stable and patient connected to face mask oxygen Cardiovascular status: blood pressure returned to baseline and stable Postop Assessment: no signs of nausea or vomiting Anesthetic complications: no    Last Vitals:  Filed Vitals:   08/10/15 0642 08/10/15 0915  BP: 116/86 122/72  Pulse: 63 76  Temp: 36.1 C 36.3 C  Resp: 20 16    Last Pain: There were no vitals filed for this visit.               Remus Hagedorn A

## 2015-08-10 NOTE — H&P (Signed)
Anthony Klein is an 39 y.o. male.   Chief Complaint: airway obstruction and nasal deformity HPI: Prev ious injury and seotal deviatio and scarring from an accident  Airway obstruction and septal deviation and lack of lower latera cartilage  Obstructing airways bilaterally  Past Medical History  Diagnosis Date  . 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 Scar of lip 07/2015    Past Surgical History  Procedure Laterality Date  . Rhinoplasty  201  . Facial cosmetic surgery  2013    laser facial surgery, per pt.    History reviewed. No pertinent family history. Social History:  reports that he has never smoked. He has never used smokeless tobacco. He reports that he drinks alcohol. He reports that he does not use illicit drugs.  Allergies: No Known Allergies  Medications Prior to Admission  Medication Sig Dispense Refill  . Multiple Vitamin (MULTIVITAMIN) tablet Take 1 tablet by mouth daily.    . Omega-3 Fatty Acids (FISH OIL PO) Take by mouth.    . EPINEPHrine 0.3 mg/0.3 mL IJ SOAJ injection Inject 0.3 mLs (0.3 mg total) into the muscle once. 1 Device 1    No results found for this or any previous visit (from the past 48 hour(s)). No results found.  Review of Systems  Constitutional: Negative.   HENT: Negative.   Eyes: Negative.   Respiratory: Negative.   Cardiovascular: Negative.   Gastrointestinal: Negative.   Genitourinary: Negative.   Musculoskeletal: Negative.   Skin: Negative.   Neurological: Negative.   Psychiatric/Behavioral: Negative.     Blood pressure 116/86, pulse 63, temperature 97 F (36.1 C), temperature source Oral, resp. rate 20, height 5\' 11"  (1.803 m), weight 90.719 kg (200 lb), SpO2 100 %. Physical Exam   Assessment/Plan Nasal septal deviation and nasal degformities for rhinoseptoplasty and reconstructions  Jaretssi Kraker L 08/10/2015, 7:40 AM

## 2015-08-11 ENCOUNTER — Encounter (HOSPITAL_BASED_OUTPATIENT_CLINIC_OR_DEPARTMENT_OTHER): Payer: Self-pay | Admitting: Specialist

## 2015-08-11 NOTE — Op Note (Deleted)
Anthony Klein, Anthony Klein             ACCOUNT NO.:  [id number]  MEDICAL RECORD NO.:  [id number]  LOCATION:                               FACILITY:  [facility]  PHYSICIAN:  Earvin Hansen L. Shon Hough, Anthony KleinDATE OF BIRTH:  Jan 14, 1976  DATE OF PROCEDURE:  08/10/2015 DATE OF DISCHARGE:  08/10/2015                              OPERATIVE REPORT   A 39 year old gentleman, with previous severe trauma to his nose in the past.  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  He has indentations of the upper lateral cartilages causing when he __________ well has collapse of the areas, hypertrophy of the right and left inferior turbinates.  PROCEDURES PLANNED:  Rhino septoplasty with reconstruction of the indentations of the upper lateral cartilage area using cartilage grafts from the right postauricular region.  ANESTHESIA:  General.  DESCRIPTION OF PROCEDURE:  Preoperatively, the patient went drawings of the nose midline, demarcations of the upper and lower lateral cartilages and defects of the areas.  He underwent general anesthesia, intubated orally.  Prep was done to the facial and neck areas with Betadine soap and solution, walled off with sterile towels and drapes so as to make a sterile field.  After protected with Lacri-Lube intake, 1% Xylocaine with epinephrine injected locally throughout the area 100,000 concentration total of 20 mL, 4% cocaine packs were also placed, a total of 2 mL.  This was allowed to set up.  Next, injection was made on the scar cicatrix involving the right upper lip area, that is going to be excised and revised.  Incisions were made in the intercurrent lattice incisions of the right and left lower cartilage regions, defects of the cartilage.  I was able to remove some cartilage from the area to improve nasal contour and tip.  Next, dissection was carried out to the dorsum of the nose and some excess hump was removed using the rasp.   Excision carried out to the distal septal mucosa.  The septal mucosa was opened, down to the mucoperichondrium.  Excess cartilage was deviated from right to left, was then removed, using a swivel knife to improve the airway tremendously.  Reduction of the right and left inferior turbinates were done using the turbinectomy scissors.  Hemostasis maintained with the Bovie anticoagulation.  Next, on the right postauricular area, incision was made in the fold, carried down through skin, subcutaneous tissue to the underlying cartilage.  It was dissected out and using a large elliptical excision of the carotid protecting the anterior mucosa and skin.  They were to remove that from the area.  Using the morselizer, I was able to contour the cartilage and flatten it out and then we made a contour to __________ of the defects of the right and left lower lateral cartilage regions, which measured approximately 1 x 1.5 cm.  This then fit nicely into the pocket made previously and then was stationed with a 4-0 Monocryl suture due to mucosa.  Same was carried on both sides to improve in the areas, preventing collapse.  The mucosa was then reclosed.  Transfixion stitch of 4-0 Monocryl placed.  Nasal packs were applied to the areas using Vaseline gauze.  Next attention was  drawn to the scar cicatrix involving the right upper lip region, excised with #15 blade.  Dissection carried out laterally.  Subcutaneous closure done with a 5-0 and 6-0 Monocryl subcutaneously x2 levels and a running subcuticular stitch of 5-0 Monocryl.  Next a postauricular areas also closed with a running 4-0 Prolene sutures.  The nasal splint __________ dressings and proper Steri-Strips and gauze applied to all areas withstood the procedure very well.  ESTIMATED BLOOD LOSS:  Less than 100 mL.  COMPLICATIONS:  None.     Anthony Klein, M.D.   ______________________________ Anthony Klein, M.D.    Cathie HoopsGLT/MEDQ  D:   08/10/2015  T:  08/10/2015  Job:  [id number]

## 2015-08-11 NOTE — Op Note (Signed)
NAME:  Anthony Klein, Anthony Klein             ACCOUNT NO.:  [id number]  MEDICAL RECORD NO.:  [id number]  LOCATION:                               FACILITY:  [facility]  PHYSICIAN:  Noele Icenhour L. Erastus Bartolomei, M.D.DATE OF BIRTH:  02/23/1976  DATE OF PROCEDURE:  08/10/2015 DATE OF DISCHARGE:  08/10/2015                              OPERATIVE REPORT   A 39-year-old gentleman, with previous severe trauma to his nose in the past.  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  He has indentations of the upper lateral cartilages causing when he __________ well has collapse of the areas, hypertrophy of the right and left inferior turbinates.  PROCEDURES PLANNED:  Rhino septoplasty with reconstruction of the indentations of the upper lateral cartilage area using cartilage grafts from the right postauricular region.  ANESTHESIA:  General.  DESCRIPTION OF PROCEDURE:  Preoperatively, the patient went drawings of the nose midline, demarcations of the upper and lower lateral cartilages and defects of the areas.  He underwent general anesthesia, intubated orally.  Prep was done to the facial and neck areas with Betadine soap and solution, walled off with sterile towels and drapes so as to make a sterile field.  After protected with Lacri-Lube intake, 1% Xylocaine with epinephrine injected locally throughout the area 100,000 concentration total of 20 mL, 4% cocaine packs were also placed, a total of 2 mL.  This was allowed to set up.  Next, injection was made on the scar cicatrix involving the right upper lip area, that is going to be excised and revised.  Incisions were made in the intercurrent lattice incisions of the right and left lower cartilage regions, defects of the cartilage.  I was able to remove some cartilage from the area to improve nasal contour and tip.  Next, dissection was carried out to the dorsum of the nose and some excess hump was removed using the rasp.   Excision carried out to the distal septal mucosa.  The septal mucosa was opened, down to the mucoperichondrium.  Excess cartilage was deviated from right to left, was then removed, using a swivel knife to improve the airway tremendously.  Reduction of the right and left inferior turbinates were done using the turbinectomy scissors.  Hemostasis maintained with the Bovie anticoagulation.  Next, on the right postauricular area, incision was made in the fold, carried down through skin, subcutaneous tissue to the underlying cartilage.  It was dissected out and using a large elliptical excision of the carotid protecting the anterior mucosa and skin.  They were to remove that from the area.  Using the morselizer, I was able to contour the cartilage and flatten it out and then we made a contour to __________ of the defects of the right and left lower lateral cartilage regions, which measured approximately 1 x 1.5 cm.  This then fit nicely into the pocket made previously and then was stationed with a 4-0 Monocryl suture due to mucosa.  Same was carried on both sides to improve in the areas, preventing collapse.  The mucosa was then reclosed.  Transfixion stitch of 4-0 Monocryl placed.  Nasal packs were applied to the areas using Vaseline gauze.  Next attention was   drawn to the scar cicatrix involving the right upper lip region, excised with #15 blade.  Dissection carried out laterally.  Subcutaneous closure done with a 5-0 and 6-0 Monocryl subcutaneously x2 levels and a running subcuticular stitch of 5-0 Monocryl.  Next a postauricular areas also closed with a running 4-0 Prolene sutures.  The nasal splint __________ dressings and proper Steri-Strips and gauze applied to all areas withstood the procedure very well.  ESTIMATED BLOOD LOSS:  Less than 100 mL.  COMPLICATIONS:  None.     Yaakov GuthrieGerald L. Shon Houghruesdale, M.D.   ______________________________ Yaakov GuthrieGerald L. Shon Houghruesdale, M.D.    Cathie HoopsGLT/MEDQ  D:   08/10/2015  T:  08/10/2015  Job:  [id number]

## 2016-04-27 ENCOUNTER — Other Ambulatory Visit: Payer: Self-pay | Admitting: Family Medicine

## 2016-04-27 DIAGNOSIS — R1011 Right upper quadrant pain: Secondary | ICD-10-CM

## 2016-05-09 ENCOUNTER — Ambulatory Visit
Admission: RE | Admit: 2016-05-09 | Discharge: 2016-05-09 | Disposition: A | Discharge status: Home / Self Care | Payer: BLUE CROSS/BLUE SHIELD | Source: Ambulatory Visit | Attending: Family Medicine | Admitting: Family Medicine

## 2016-05-09 DIAGNOSIS — R1011 Right upper quadrant pain: Secondary | ICD-10-CM

## 2016-05-16 ENCOUNTER — Other Ambulatory Visit: Payer: BLUE CROSS/BLUE SHIELD

## 2017-01-24 ENCOUNTER — Encounter (HOSPITAL_COMMUNITY): Payer: Self-pay | Admitting: Emergency Medicine

## 2017-01-24 ENCOUNTER — Emergency Department (HOSPITAL_COMMUNITY)
Admission: EM | Admit: 2017-01-24 | Discharge: 2017-01-24 | Disposition: A | Discharge status: Home / Self Care | Payer: BLUE CROSS/BLUE SHIELD | Attending: Emergency Medicine | Admitting: Emergency Medicine

## 2017-01-24 DIAGNOSIS — R1084 Generalized abdominal pain: Secondary | ICD-10-CM | POA: Insufficient documentation

## 2017-01-24 LAB — CBC
HCT: 38.5 % — ABNORMAL LOW (ref 39.0–52.0)
Hemoglobin: 13.6 g/dL (ref 13.0–17.0)
MCH: 28.9 pg (ref 26.0–34.0)
MCHC: 35.3 g/dL (ref 30.0–36.0)
MCV: 81.9 fL (ref 78.0–100.0)
PLATELETS: 204 10*3/uL (ref 150–400)
RBC: 4.7 MIL/uL (ref 4.22–5.81)
RDW: 13 % (ref 11.5–15.5)
WBC: 7.5 10*3/uL (ref 4.0–10.5)

## 2017-01-24 LAB — COMPREHENSIVE METABOLIC PANEL
ALBUMIN: 3.7 g/dL (ref 3.5–5.0)
ALT: 33 U/L (ref 17–63)
ANION GAP: 8 (ref 5–15)
AST: 24 U/L (ref 15–41)
Alkaline Phosphatase: 90 U/L (ref 38–126)
BILIRUBIN TOTAL: 1.1 mg/dL (ref 0.3–1.2)
BUN: 14 mg/dL (ref 6–20)
CHLORIDE: 105 mmol/L (ref 101–111)
CO2: 24 mmol/L (ref 22–32)
Calcium: 9.1 mg/dL (ref 8.9–10.3)
Creatinine, Ser: 0.92 mg/dL (ref 0.61–1.24)
GFR calc Af Amer: >60 mL/min (ref >60)
GFR calc non Af Amer: >60 mL/min (ref >60)
GLUCOSE: 96 mg/dL (ref 65–99)
POTASSIUM: 4 mmol/L (ref 3.5–5.1)
SODIUM: 137 mmol/L (ref 135–145)
TOTAL PROTEIN: 7.2 g/dL (ref 6.5–8.1)

## 2017-01-24 LAB — POC OCCULT BLOOD, ED: Fecal Occult Bld: NEGATIVE

## 2017-01-24 LAB — LIPASE, BLOOD: Lipase: 32 U/L (ref 11–51)

## 2017-01-24 MED ORDER — PANTOPRAZOLE SODIUM 20 MG PO TBEC: 20.0000 mg | DELAYED_RELEASE_TABLET | Freq: Every day | ORAL | 0 refills | Status: AC

## 2017-01-24 MED ORDER — DICYCLOMINE HCL 20 MG PO TABS: 20.0000 mg | ORAL_TABLET | Freq: Two times a day (BID) | ORAL | 0 refills | Status: AC

## 2017-01-24 MED ORDER — GI COCKTAIL ~~LOC~~
30.0000 mL | Freq: Once | ORAL | Status: AC
  Administered 2017-01-24: 30 mL via ORAL
  Filled 2017-01-24: qty 30

## 2017-01-24 NOTE — ED Provider Notes (Signed)
[facility]ENC[facility] DEPT Provider Note   CSN: [id number] Arrival date & time: 01/24/17  0831     History   Chief Complaint No chief complaint on file.   HPI Baldwin Racicot is a 41 y.o. male.  HPI  41 y.o. male presents to the Emergency Department today due to black stools x 2 days. Notes associated cramping. Endorses generalized abdominal pain with no focal tenderness. Notes hx same in past, but states black stool is new. Pt states that he has been taking Pepto-Bismol as well as magnesium to help with symptoms. No N/V. No hematochezia. No diarrhea. No CP/SOB. No chills or night sweats. Pt rates pain 7/10. No meds PTA. No fevers. No other symptoms noted. Pt does endorse endoscopy x 10 years ago due to gastric issues that was unremarkable.   Past Medical History:  Diagnosis Date  . 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   Past Surgical History:  Procedure Laterality Date  . FACIAL COSMETIC SURGERY  2013   laser facial surgery, per pt.  Marland Kitchen RHINOPLASTY  201  . SCAR REVISION Bilateral 08/10/2015   Procedure: SCAR REVISION LIP;  Surgeon: Louisa Second, MD;  Location: [facility];  Service: Plastics;  Laterality: Bilateral;  . SEPTOPLASTY Bilateral 08/10/2015   Procedure: RECONSTRUCTION RHINO SEPTOPLASTY CARTILAGE GRAFT REVISED BILATERAL INFERIOR TURBINaTES SCAR REVISION LIP;  Surgeon: Louisa Second, MD;  Location: [facility];  Service: Plastics;  Laterality: Bilateral;       Home Medications    Prior to Admission medications   Not on File    Family History No family history on file.  Social History Social History  Substance Use Topics  . Smoking status: Never Smoker  . Smokeless tobacco: Never Used  . Alcohol use Yes     Comment: occasionally     Allergies   Patient has no known allergies.   Review of Systems Review of Systems ROS reviewed and all are  negative for acute change except as noted in the HPI.  Physical Exam Updated Vital Signs BP 114/80 (BP Location: Right Arm)   Pulse 72   Temp 97.9 F (36.6 C) (Oral)   Resp 19   SpO2 98%   Physical Exam  Constitutional: He is oriented to person, place, and time. Vital signs are normal. He appears well-developed and well-nourished.  HENT:  Head: Normocephalic and atraumatic.  Right Ear: Hearing normal.  Left Ear: Hearing normal.  Eyes: Conjunctivae and EOM are normal. Pupils are equal, round, and reactive to light.  Neck: Normal range of motion. Neck supple.  Cardiovascular: Normal rate, regular rhythm, normal heart sounds and intact distal pulses.   Pulmonary/Chest: Effort normal and breath sounds normal.  Abdominal: Soft. Normal appearance and bowel sounds are normal. There is no rebound, no guarding, no CVA tenderness, no tenderness at McBurney's point and negative Murphy's sign.  Abdomen soft.   Genitourinary:  Genitourinary Comments: Chaperone was present. Patient with no pain around the rectal area. There are no external fissures noted. No induration of the skin or swelling. No external hemorrhoids seen. Patient able to tolerate examination. I was able to feel the first 2-3cm of the rectum digitally without gross abnormality. Slightly darkened stool. Non melanotic. There is no gross blood.  No signs of perirectal abscess.  Musculoskeletal: Normal range of motion.  Neurological: He is alert and oriented to person, place, and time.  Skin:  Skin is warm and dry.  Psychiatric: He has a normal mood and affect. His speech is normal and behavior is normal. Thought content normal.  Nursing note and vitals reviewed.  ED Treatments / Results  Labs (all labs ordered are listed, but only abnormal results are displayed) Labs Reviewed  CBC - Abnormal; Notable for the following:       Result Value   HCT 38.5 (*)    All other components within normal limits  COMPREHENSIVE METABOLIC PANEL    LIPASE, BLOOD  POC OCCULT BLOOD, ED    EKG  EKG Interpretation None       Radiology No results found.  Procedures Procedures (including critical care time)  Medications Ordered in ED Medications - No data to display   Initial Impression / Assessment and Plan / ED Course  I have reviewed the triage vital signs and the nursing notes.  Pertinent labs & imaging results that were available during my care of the patient were reviewed by me and considered in my medical decision making (see chart for details).  Final Clinical Impressions(s) / ED Diagnoses  {I have reviewed and evaluated the relevant laboratory values.   {I have reviewed the relevant previous healthcare records.  {I obtained HPI from historian.   ED Course:  Assessment: Patient is a 41 y.o. male presents with abdominal pain x 2-3 days. Hx same. Noted black stools x 2 days ago, which is new. Notes taking pepto bismol for abdominal discomfort. On exam, nontoxic, nonseptic appearing, in no apparent distress. Patient's pain and other symptoms adequately managed in emergency department. Labs and vitals reviewed. CBC unremarkable. POC Stool negative. Patient does not meet the SIRS or Sepsis criteria.  On repeat exam patient does not have a surgical abdomen and there are no peritoneal signs.  DRE exam unremarkable. No indication of appendicitis, bowel obstruction, bowel perforation, cholecystitis, diverticulitis. Patient discharged home with symptomatic treatment and given strict instructions for follow-up with their primary care physician.  Started on PPI as pt improved with GI cocktail. I have also discussed reasons to return immediately to the ER.  Patient expresses understanding and agrees with plan.  Disposition/Plan:  DC Home Additional Verbal discharge instructions given and discussed with patient.  Pt Instructed to f/u with GI in the next week for evaluation and treatment of symptoms. Return precautions given Pt  acknowledges and agrees with plan  Supervising Physician Shaune PollackIsaacs, Cameron, MD  Final diagnoses:  Generalized abdominal pain    New Prescriptions New Prescriptions   No medications on file     Audry PiliMohr, Lilliane Sposito, Cordelia Poche-C 01/24/17 1152    Shaune PollackIsaacs, Cameron, MD 01/25/17 1327

## 2017-01-24 NOTE — ED Triage Notes (Signed)
Pt here with c/o dark tarry stools-- for 2 days-- with cramping, has been taking antacids for past few days-- has had GI symptoms in past with endoscopy. Alert/oriented x 4.

## 2017-01-24 NOTE — Discharge Instructions (Signed)
Please read and follow all provided instructions.  Your diagnoses today include:  1. Generalized abdominal pain     Tests performed today include: Blood counts and electrolytes Blood tests to check liver and kidney function Blood tests to check pancreas function Urine test to look for infection and pregnancy (in women) Vital signs. See below for your results today.   Medications prescribed:   Take any prescribed medications only as directed.  Home care instructions:  Follow any educational materials contained in this packet.  Follow-up instructions: Please follow-up with your primary care provider in the next 2 days for further evaluation of your symptoms.    Return instructions:  SEEK IMMEDIATE MEDICAL ATTENTION IF: The pain does not go away or becomes severe  A temperature above 101F develops  Repeated vomiting occurs (multiple episodes)  The pain becomes localized to portions of the abdomen. The right side could possibly be appendicitis. In an adult, the left lower portion of the abdomen could be colitis or diverticulitis.  Blood is being passed in stools or vomit (bright red or black tarry stools)  You develop chest pain, difficulty breathing, dizziness or fainting, or become confused, poorly responsive, or inconsolable (young children) If you have any other emergent concerns regarding your health  Additional Information: Abdominal (belly) pain can be caused by many things. Your caregiver performed an examination and possibly ordered blood/urine tests and imaging (CT scan, x-rays, ultrasound). Many cases can be observed and treated at home after initial evaluation in the emergency department. Even though you are being discharged home, abdominal pain can be unpredictable. Therefore, you need a repeated exam if your pain does not resolve, returns, or worsens. Most patients with abdominal pain don't have to be admitted to the hospital or have surgery, but serious problems like  appendicitis and gallbladder attacks can start out as nonspecific pain. Many abdominal conditions cannot be diagnosed in one visit, so follow-up evaluations are very important.  Your vital signs today were: BP 114/80 (BP Location: Right Arm)    Pulse 72    Temp 97.9 F (36.6 C) (Oral)    Resp 19    Ht 5\' 11"  (1.803 m)    Wt 90.7 kg (200 lb)    SpO2 98%    BMI 27.89 kg/m  If your blood pressure (bp) was elevated above 135/85 this visit, please have this repeated by your doctor within one month. --------------

## 2017-08-17 ENCOUNTER — Other Ambulatory Visit: Payer: Self-pay

## 2017-09-17 ENCOUNTER — Other Ambulatory Visit: Payer: Self-pay | Admitting: Gastroenterology

## 2017-09-17 DIAGNOSIS — R109 Unspecified abdominal pain: Secondary | ICD-10-CM

## 2017-10-02 ENCOUNTER — Ambulatory Visit
Admission: RE | Admit: 2017-10-02 | Discharge: 2017-10-02 | Disposition: A | Discharge status: Home / Self Care | Payer: BLUE CROSS/BLUE SHIELD | Source: Ambulatory Visit | Attending: Gastroenterology | Admitting: Gastroenterology

## 2017-10-02 DIAGNOSIS — R109 Unspecified abdominal pain: Secondary | ICD-10-CM

## 2017-10-11 ENCOUNTER — Emergency Department (HOSPITAL_COMMUNITY)
Admission: EM | Admit: 2017-10-11 | Discharge: 2017-10-11 | Disposition: A | Discharge status: Home / Self Care | Payer: BLUE CROSS/BLUE SHIELD | Attending: Emergency Medicine | Admitting: Emergency Medicine

## 2017-10-11 ENCOUNTER — Encounter (HOSPITAL_COMMUNITY): Payer: Self-pay | Admitting: Emergency Medicine

## 2017-10-11 ENCOUNTER — Other Ambulatory Visit: Payer: Self-pay

## 2017-10-11 ENCOUNTER — Emergency Department (HOSPITAL_COMMUNITY): Payer: BLUE CROSS/BLUE SHIELD

## 2017-10-11 DIAGNOSIS — R112 Nausea with vomiting, unspecified: Secondary | ICD-10-CM | POA: Diagnosis not present

## 2017-10-11 DIAGNOSIS — R42 Dizziness and giddiness: Secondary | ICD-10-CM | POA: Insufficient documentation

## 2017-10-11 DIAGNOSIS — Z79899 Other long term (current) drug therapy: Secondary | ICD-10-CM | POA: Insufficient documentation

## 2017-10-11 DIAGNOSIS — R072 Precordial pain: Secondary | ICD-10-CM | POA: Insufficient documentation

## 2017-10-11 DIAGNOSIS — K2970 Gastritis, unspecified, without bleeding: Secondary | ICD-10-CM

## 2017-10-11 DIAGNOSIS — K2900 Acute gastritis without bleeding: Secondary | ICD-10-CM | POA: Insufficient documentation

## 2017-10-11 DIAGNOSIS — R0602 Shortness of breath: Secondary | ICD-10-CM | POA: Diagnosis present

## 2017-10-11 DIAGNOSIS — R110 Nausea: Secondary | ICD-10-CM

## 2017-10-11 LAB — COMPREHENSIVE METABOLIC PANEL
ALK PHOS: 92 U/L (ref 38–126)
ALT: 35 U/L (ref 17–63)
ANION GAP: 10 (ref 5–15)
AST: 26 U/L (ref 15–41)
Albumin: 4 g/dL (ref 3.5–5.0)
BUN: 12 mg/dL (ref 6–20)
CALCIUM: 9.5 mg/dL (ref 8.9–10.3)
CHLORIDE: 105 mmol/L (ref 101–111)
CO2: 22 mmol/L (ref 22–32)
Creatinine, Ser: 0.95 mg/dL (ref 0.61–1.24)
Glucose, Bld: 100 mg/dL — ABNORMAL HIGH (ref 65–99)
Potassium: 4.7 mmol/L (ref 3.5–5.1)
SODIUM: 137 mmol/L (ref 135–145)
Total Bilirubin: 0.8 mg/dL (ref 0.3–1.2)
Total Protein: 7.5 g/dL (ref 6.5–8.1)

## 2017-10-11 LAB — CBC
HCT: 41 % (ref 39.0–52.0)
HEMOGLOBIN: 14.2 g/dL (ref 13.0–17.0)
MCH: 29 pg (ref 26.0–34.0)
MCHC: 34.6 g/dL (ref 30.0–36.0)
MCV: 83.7 fL (ref 78.0–100.0)
PLATELETS: 213 10*3/uL (ref 150–400)
RBC: 4.9 MIL/uL (ref 4.22–5.81)
RDW: 13.4 % (ref 11.5–15.5)
WBC: 6.2 10*3/uL (ref 4.0–10.5)

## 2017-10-11 LAB — URINALYSIS, ROUTINE W REFLEX MICROSCOPIC
Bilirubin Urine: NEGATIVE
Glucose, UA: NEGATIVE mg/dL
Hgb urine dipstick: NEGATIVE
Ketones, ur: NEGATIVE mg/dL
LEUKOCYTES UA: NEGATIVE
Nitrite: NEGATIVE
PROTEIN: NEGATIVE mg/dL
SPECIFIC GRAVITY, URINE: 1.019 (ref 1.005–1.030)
pH: 5 (ref 5.0–8.0)

## 2017-10-11 LAB — I-STAT TROPONIN, ED: TROPONIN I, POC: 0 ng/mL (ref 0.00–0.08)

## 2017-10-11 LAB — LIPASE, BLOOD: LIPASE: 28 U/L (ref 11–51)

## 2017-10-11 MED ORDER — ONDANSETRON 4 MG PO TBDP: 4.0000 mg | ORAL_TABLET | Freq: Three times a day (TID) | ORAL | 0 refills | Status: AC | PRN

## 2017-10-11 MED ORDER — RANITIDINE HCL 150 MG PO TABS: 150.0000 mg | ORAL_TABLET | Freq: Two times a day (BID) | ORAL | 0 refills | Status: AC

## 2017-10-11 MED ORDER — ONDANSETRON 4 MG PO TBDP
8.0000 mg | ORAL_TABLET | Freq: Once | ORAL | Status: AC
  Administered 2017-10-11: 8 mg via ORAL
  Filled 2017-10-11: qty 2

## 2017-10-11 MED ORDER — GI COCKTAIL ~~LOC~~
30.0000 mL | Freq: Once | ORAL | Status: AC
  Administered 2017-10-11: 30 mL via ORAL
  Filled 2017-10-11: qty 30

## 2017-10-11 NOTE — ED Triage Notes (Signed)
Patient presents to ED for assessment of 1+ month of intermittent nausea, with previous GI assessment.  Patient states he also has intermittent light-headedness.  YEsterday he states the nausea was severe, the light-headedness was more constant, and he began to have SOB.  Patient c/o lower mid abdominal pain yesterday, denies today.

## 2017-10-11 NOTE — ED Provider Notes (Signed)
[facility] EMERGENCY DEPARTMENT Provider Note   CSN: [id number] Arrival date & time: 10/11/17  1054     History   Chief Complaint Chief Complaint  Patient presents with  . Shortness of Breath  . Nausea    HPI Anthony Klein is a 42 y.o. male with a PMHx of HLD, fatty liver, and deviated nasal septum s/p repair, who presents to the ED with complaints of intermittent nausea for 4 weeks for which he has had GI workup by Dr. Dulce Sellar (had a right upper quadrant ultrasound which showed fatty liver, negative fecal occult blood tests), and then intermittent lightheadedness since last night, as well as "a little bit" of shortness of breath this morning which improved, and chest tightness that began yesterday while he was seated at work around noon and has continued however has gradually improved since then.  He states that the intermittent nausea has been an ongoing issue for several weeks, but the remainder of the symptoms only occurred within the last 24 hours.  He describes his chest discomfort as 3/10 constant tightness in the center of his chest that radiates to his upper back, with no known aggravating factors, unchanged with inspiration or exertion, and with no treatments tried prior to arrival.  He mentions that he was fasting for lab work so he has not eaten anything since 6 PM last night, and believes that the intermittent lightheadedness is because of that.  He also states that yesterday he did not eat very much because of the nausea, and therefore thinks his lightheadedness is all related to the lack of PO intake.  He denies any known history of diabetes or hypertension.  He is a non-smoker.  Positive family history of MI in his father when he was in his 87s (survived) and his maternal grandfather at 2 (died).   He denies diaphoresis, fevers, chills, cough, ongoing SOB, LE swelling, recent travel/surgery/immobilization, personal/family hx of DVT/PE, abd pain, V/D/C, melena,  hematochezia, obstipation, hematuria, dysuria, myalgias, arthralgias, claudication, orthopnea, numbness, tingling, focal weakness, or any other complaints at this time.  He also denies any sick contacts, suspicious food intake, EtOH use, NSAID use, or prior abdominal surgeries.    The history is provided by medical records and the patient. No language interpreter was used.  Shortness of Breath  Associated symptoms include chest pain (tightness). Pertinent negatives include no fever, no cough, no orthopnea, no vomiting, no abdominal pain, no leg swelling and no claudication. Associated medical issues do not include PE or DVT.  Chest Pain   This is a new problem. The current episode started yesterday. The problem occurs constantly. The problem has been gradually improving. The pain is associated with rest. The pain is present in the substernal region. The pain is at a severity of 3/10. The pain is mild. Quality: tightness. The pain radiates to the upper back. Duration of episode(s) is 1 day. Exacerbated by: nothing. Associated symptoms include nausea and shortness of breath (resolved). Pertinent negatives include no abdominal pain, no claudication, no cough, no diaphoresis, no fever, no lower extremity edema, no numbness, no orthopnea, no vomiting and no weakness. He has tried nothing for the symptoms. The treatment provided no relief. Risk factors include male gender.  His past medical history is significant for hyperlipidemia.  Pertinent negatives for past medical history include no diabetes, no DVT, no hypertension and no PE.  His family medical history is significant for CAD.  Pertinent negatives for family medical history include: no PE.  Past Medical History:  Diagnosis Date  . 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   Past Surgical History:  Procedure Laterality Date  . FACIAL COSMETIC SURGERY  2013   laser  facial surgery, per pt.  Marland Kitchen RHINOPLASTY  201  . SCAR REVISION Bilateral 08/10/2015   Procedure: SCAR REVISION LIP;  Surgeon: Louisa Second, MD;  Location: [facility];  Service: Plastics;  Laterality: Bilateral;  . SEPTOPLASTY Bilateral 08/10/2015   Procedure: RECONSTRUCTION RHINO SEPTOPLASTY CARTILAGE GRAFT REVISED BILATERAL INFERIOR TURBINaTES SCAR REVISION LIP;  Surgeon: Louisa Second, MD;  Location: [facility];  Service: Plastics;  Laterality: Bilateral;       Home Medications    Prior to Admission medications   Medication Sig Start Date End Date Taking? Authorizing Provider  dicyclomine (BENTYL) 20 MG tablet Take 1 tablet (20 mg total) by mouth 2 (two) times daily. 01/24/17   Audry Pili, PA-C  magnesium 30 MG tablet Take 30 mg by mouth 2 (two) times daily.    [provider]  Multiple Vitamins-Minerals (MULTIVITAMIN WITH MINERALS) tablet Take 1 tablet by mouth daily.    [provider]  Omega-3 Fatty Acids (FISH OIL) 1000 MG CPDR Take 1,000 mg by mouth daily.    [provider]  pantoprazole (PROTONIX) 20 MG tablet Take 1 tablet (20 mg total) by mouth daily. 01/24/17   Audry Pili, PA-C    Family History History reviewed. No pertinent family history.  Social History Social History   Tobacco Use  . Smoking status: Never Smoker  . Smokeless tobacco: Never Used  Substance Use Topics  . Alcohol use: Yes    Comment: occasionally  . Drug use: No     Allergies   Patient has no known allergies.   Review of Systems Review of Systems  Constitutional: Negative for chills, diaphoresis and fever.  Respiratory: Positive for shortness of breath (resolved). Negative for cough.   Cardiovascular: Positive for chest pain (tightness). Negative for orthopnea, claudication and leg swelling.  Gastrointestinal: Positive for nausea. Negative for abdominal pain, blood in stool, constipation, diarrhea and vomiting.  Genitourinary:  Negative for dysuria and hematuria.  Musculoskeletal: Negative for arthralgias and myalgias.  Skin: Negative for color change.  Allergic/Immunologic: Negative for immunocompromised state.  Neurological: Positive for light-headedness (intermittent). Negative for weakness and numbness.  Psychiatric/Behavioral: Negative for confusion.   All other systems reviewed and are negative for acute change except as noted in the HPI.    Physical Exam Updated Vital Signs BP 103/85 (BP Location: Right Arm)   Pulse 61   Temp 98.3 F (36.8 C) (Oral)   Resp 16   Ht 5\' 11"  (1.803 m)   Wt 93.4 kg (206 lb)   SpO2 99%   BMI 28.73 kg/m   Physical Exam  Constitutional: He is oriented to person, place, and time. Vital signs are normal. He appears well-developed and well-nourished.  Non-toxic appearance. No distress.  Afebrile, nontoxic, NAD  HENT:  Head: Normocephalic and atraumatic.  Mouth/Throat: Oropharynx is clear and moist and mucous membranes are normal.  Eyes: Conjunctivae and EOM are normal. Right eye exhibits no discharge. Left eye exhibits no discharge.  Neck: Normal range of motion. Neck supple.  Cardiovascular: Normal rate, regular rhythm, normal heart sounds and intact distal pulses. Exam reveals no gallop and no friction rub.  No murmur heard. RRR, nl s1/s2, no m/r/g, distal pulses  intact, no pedal edema   Pulmonary/Chest: Effort normal and breath sounds normal. No respiratory distress. He has no decreased breath sounds. He has no wheezes. He has no rhonchi. He has no rales. He exhibits no tenderness, no crepitus, no deformity and no retraction.  CTAB in all lung fields, no w/r/r, no hypoxia or increased WOB, speaking in full sentences, SpO2 99% on RA Chest wall nonTTP without crepitus, deformities, or retractions   Abdominal: Soft. Normal appearance and bowel sounds are normal. He exhibits no distension. There is tenderness in the epigastric area. There is no rigidity, no rebound, no  guarding, no CVA tenderness, no tenderness at McBurney's point and negative Murphy's sign.  Soft, nondistended, +BS throughout, with mild epigastric TTP, no r/g/r, neg murphy's, neg mcburney's, no CVA TTP   Musculoskeletal: Normal range of motion.  MAE x4 Strength and sensation grossly intact in all extremities Distal pulses intact No pedal edema, neg homan's bilaterally   Neurological: He is alert and oriented to person, place, and time. He has normal strength. No sensory deficit.  Skin: Skin is warm, dry and intact. No rash noted.  Psychiatric: He has a normal mood and affect.  Nursing note and vitals reviewed.  ORTHOSTATICS: Orthostatic VS for the past 24 hrs:  BP- Lying Pulse- Lying BP- Sitting Pulse- Sitting BP- Standing at 0 minutes Pulse- Standing at 0 minutes  10/11/17 1657 110/77 50 116/89 56 117/82 61      ED Treatments / Results  Labs (all labs ordered are listed, but only abnormal results are displayed) Labs Reviewed  COMPREHENSIVE METABOLIC PANEL - Abnormal; Notable for the following components:      Result Value   Glucose, Bld 100 (*)    All other components within normal limits  LIPASE, BLOOD  CBC  URINALYSIS, ROUTINE W REFLEX MICROSCOPIC  I-STAT TROPONIN, ED    EKG  EKG Interpretation  Date/Time:  Wednesday October 11 2017 11:26:20 EST Ventricular Rate:  66 PR Interval:  160 QRS Duration: 82 QT Interval:  396 QTC Calculation: 415 R Axis:   -9 Text Interpretation:  Normal sinus rhythm Normal ECG No previous tracing Confirmed by Cathren Laine ([id number]) on 10/11/2017 4:13:15 PM       Radiology Dg Chest 2 View  Result Date: 10/11/2017 CLINICAL DATA:  Chest pain and shortness of breath. EXAM: CHEST  2 VIEW COMPARISON:  None. FINDINGS: The heart size and mediastinal contours are within normal limits. Both lungs are clear. The visualized skeletal structures are unremarkable. IMPRESSION: No active cardiopulmonary disease. Electronically Signed   By: Obie Dredge M.D.   On: 10/11/2017 16:41     RUQ U/S 10/02/17: Study Result  CLINICAL DATA:  Abdominal pain for 1 month  EXAM: ULTRASOUND ABDOMEN LIMITED RIGHT UPPER QUADRANT  COMPARISON:  05/09/2016  FINDINGS: Gallbladder:  No gallstones or wall thickening visualized. No sonographic Murphy sign noted by sonographer.  Common bile duct:  Diameter: 5.4 mm  Liver:  Mildly increased in echogenicity consistent with fatty infiltration. Focal fatty sparing is noted adjacent to the gallbladder. Portal vein is patent on color Doppler imaging with normal direction of blood flow towards the liver.  IMPRESSION: Fatty liver.  No acute abnormality noted.   Electronically Signed   By: Alcide Clever M.D.   On: 10/02/2017 09:31     Procedures Procedures (including critical care time)  Medications Ordered in ED Medications  ondansetron (ZOFRAN-ODT) disintegrating tablet 8 mg (8 mg Oral Given 10/11/17 1623)  gi cocktail (Maalox,Lidocaine,Donnatal) (30  mLs Oral Given 10/11/17 1623)     Initial Impression / Assessment and Plan / ED Course  I have reviewed the triage vital signs and the nursing notes.  Pertinent labs & imaging results that were available during my care of the patient were reviewed by me and considered in my medical decision making (see chart for details).     42 y.o. male here with 4wks of intermittent nausea for which he's already had a RUQ U/S done which showed fatty liver, and also c/o intermittent lightheadedness since last night (which he thinks is from fasting for his labs), brief SOB this morning which resolved, and chest tightness since yesterday which has improved but still occurring. On exam, no tachycardia or hypoxia, no pedal edema, neg homan's sign, mild epigastric TTP, neg murphy's sign, nonperitoneal abdomen, clear lung exam, no focal chest wall tenderness. Work up thus far reveals: EKG nonischemic; U/A WNL; lipase WNL; CMP WNL; CBC WNL. Given his c/o  chest tightness, will get troponin and CXR, however doubt need for other emergent work up; doubt PE given low risk factors and no exam findings consistent with that; doubt dissection or other acute cardiopulmonary etiology requiring more extensive work up than what's already listed. Will get orthostatics as well. Will give zofran and GI cocktail, then reassess shortly.   5:33 PM Troponin negative at 28hr mark from onset, highly doubt need for repeat troponin or further emergent work up at this time. CXR negative. Orthostatics negative. Pt feeling better and tolerating PO well, states meds helped his symptoms, and eating has helped resolve his lightheadedness. Overall, symptoms consistent with gastritis/GERD/PUD, and lightheadedness could be from the poor PO intake yesterday and today. Discussed diet/lifestyle modifications for symptoms, will start on zantac/zofran, advised tylenol and avoidance/sparing use of NSAIDs only on full stomach, discussed other OTC remedies for symptomatic relief, and f/up with PCP AND his GI specialist in 5-7 days for recheck of symptoms and ongoing evaluation/management. I explained the diagnosis and have given explicit precautions to return to the ER including for any other new or worsening symptoms. The patient understands and accepts the medical plan as it's been dictated and I have answered their questions. Discharge instructions concerning home care and prescriptions have been given. The patient is STABLE and is discharged to home in good condition.     Final Clinical Impressions(s) / ED Diagnoses   Final diagnoses:  Nausea  Intermittent lightheadedness  Precordial chest pain  Gastritis, presence of bleeding unspecified, unspecified chronicity, unspecified gastritis type    ED Discharge Orders        Ordered    ranitidine (ZANTAC) 150 MG tablet  2 times daily     10/11/17 1732    ondansetron (ZOFRAN ODT) 4 MG disintegrating tablet  Every 8 hours PRN     10/11/17  [address]1732       Nitza Schmid, [place]cedes, [place] JerseyPA-C 10/11/17 1733    Cathren LaineSteinl, Kevin, MD 10/11/17 2141

## 2017-10-11 NOTE — Discharge Instructions (Signed)
Your work up today has been reassuring. Your symptoms are most likely from gastritis or an ulcer/irritation of the stomach, and the lightheadedness was likely from inadequate food intake. You will need to take zantac as directed, and avoid spicy/fatty/acidic foods, avoid soda/coffee/tea/alcohol. Avoid laying down flat within 30 minutes of eating. Avoid NSAIDs like ibuprofen/aleve/motrin/etc on an empty stomach. May consider using over the counter tums/maalox as needed for additional relief. Use zofran as directed as needed for nausea. Use tylenol as needed for pain. Follow up with your  gastroenterologist AND your regular doctor in 5-7 days for recheck of symptoms and ongoing management of your symptoms. Return to the ER for changes or worsening symptoms.

## 2018-07-14 ENCOUNTER — Emergency Department (HOSPITAL_COMMUNITY): Payer: BLUE CROSS/BLUE SHIELD

## 2018-07-14 ENCOUNTER — Emergency Department (HOSPITAL_COMMUNITY)
Admission: EM | Admit: 2018-07-14 | Discharge: 2018-07-14 | Disposition: A | Discharge status: Home / Self Care | Payer: BLUE CROSS/BLUE SHIELD | Attending: Emergency Medicine | Admitting: Emergency Medicine

## 2018-07-14 ENCOUNTER — Encounter (HOSPITAL_COMMUNITY): Payer: Self-pay | Admitting: *Deleted

## 2018-07-14 ENCOUNTER — Other Ambulatory Visit: Payer: Self-pay

## 2018-07-14 DIAGNOSIS — K760 Fatty (change of) liver, not elsewhere classified: Secondary | ICD-10-CM | POA: Diagnosis not present

## 2018-07-14 DIAGNOSIS — Z79899 Other long term (current) drug therapy: Secondary | ICD-10-CM | POA: Insufficient documentation

## 2018-07-14 DIAGNOSIS — R1031 Right lower quadrant pain: Secondary | ICD-10-CM | POA: Diagnosis present

## 2018-07-14 DIAGNOSIS — R1011 Right upper quadrant pain: Secondary | ICD-10-CM

## 2018-07-14 LAB — COMPREHENSIVE METABOLIC PANEL
ALT: 28 U/L (ref 0–44)
AST: 24 U/L (ref 15–41)
Albumin: 3.8 g/dL (ref 3.5–5.0)
Alkaline Phosphatase: 92 U/L (ref 38–126)
Anion gap: 8 (ref 5–15)
BILIRUBIN TOTAL: 1 mg/dL (ref 0.3–1.2)
BUN: 15 mg/dL (ref 6–20)
CO2: 21 mmol/L — ABNORMAL LOW (ref 22–32)
CREATININE: 1.07 mg/dL (ref 0.61–1.24)
Calcium: 9.1 mg/dL (ref 8.9–10.3)
Chloride: 107 mmol/L (ref 98–111)
GFR calc Af Amer: >60 mL/min (ref >60)
Glucose, Bld: 110 mg/dL — ABNORMAL HIGH (ref 70–99)
POTASSIUM: 3.8 mmol/L (ref 3.5–5.1)
SODIUM: 136 mmol/L (ref 135–145)
TOTAL PROTEIN: 7.4 g/dL (ref 6.5–8.1)

## 2018-07-14 LAB — URINALYSIS, ROUTINE W REFLEX MICROSCOPIC
Bilirubin Urine: NEGATIVE
GLUCOSE, UA: NEGATIVE mg/dL
HGB URINE DIPSTICK: NEGATIVE
KETONES UR: NEGATIVE mg/dL
Leukocytes, UA: NEGATIVE
Nitrite: NEGATIVE
PROTEIN: NEGATIVE mg/dL
Specific Gravity, Urine: 1.024 (ref 1.005–1.030)
pH: 6 (ref 5.0–8.0)

## 2018-07-14 LAB — CBC WITH DIFFERENTIAL/PLATELET
ABS IMMATURE GRANULOCYTES: 0.01 10*3/uL (ref 0.00–0.07)
BASOS ABS: 0.1 10*3/uL (ref 0.0–0.1)
BASOS PCT: 1 %
Eosinophils Absolute: 0.1 10*3/uL (ref 0.0–0.5)
Eosinophils Relative: 1 %
HCT: 42.8 % (ref 39.0–52.0)
HEMOGLOBIN: 14.2 g/dL (ref 13.0–17.0)
IMMATURE GRANULOCYTES: 0 %
LYMPHS PCT: 40 %
Lymphs Abs: 3 10*3/uL (ref 0.7–4.0)
MCH: 27.7 pg (ref 26.0–34.0)
MCHC: 33.2 g/dL (ref 30.0–36.0)
MCV: 83.6 fL (ref 80.0–100.0)
MONO ABS: 0.5 10*3/uL (ref 0.1–1.0)
Monocytes Relative: 7 %
NEUTROS ABS: 3.9 10*3/uL (ref 1.7–7.7)
NEUTROS PCT: 51 %
NRBC: 0 % (ref 0.0–0.2)
Platelets: 208 10*3/uL (ref 150–400)
RBC: 5.12 MIL/uL (ref 4.22–5.81)
RDW: 12.9 % (ref 11.5–15.5)
WBC: 7.5 10*3/uL (ref 4.0–10.5)

## 2018-07-14 LAB — LIPASE, BLOOD: LIPASE: 29 U/L (ref 11–51)

## 2018-07-14 MED ORDER — IOHEXOL 300 MG/ML  SOLN
100.0000 mL | Freq: Once | INTRAMUSCULAR | Status: AC | PRN
  Administered 2018-07-14: 100 mL via INTRAVENOUS

## 2018-07-14 NOTE — ED Notes (Signed)
[place], [place], advised pt is next. Pt aware.

## 2018-07-14 NOTE — ED Notes (Signed)
Pt aware of need for urine specimen. 

## 2018-07-14 NOTE — ED Triage Notes (Signed)
C/o right abd pain w/nausea - worsening.

## 2018-07-14 NOTE — ED Provider Notes (Signed)
[facility] Provider Note   CSN: [id number] Arrival date & time: 07/14/18  0809     History   Chief Complaint Chief Complaint  Patient presents with  . Abdominal Pain    HPI Anthony Klein is a 42 y.o. male.  42yo male presents with complaint of sharp right flank pain, onset this morning, woke him from sleep today. Pain is worse with moving from sitting to standing as well as with deep breaths. Patient states he has had this pain for several months, seen in the ER 09/2017 with similar pain and told he had a fatty liver. Patient followed up with GI and is scheduled for endoscopy in 2 months. Patient reports associated nausea, no vomiting, no changes in bowel habits (states he is generally constipated), states he has had these symptoms since his last ER visit in February but pain was worse today.      Past Medical History:  Diagnosis Date  . 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   Past Surgical History:  Procedure Laterality Date  . FACIAL COSMETIC SURGERY  2013   laser facial surgery, per pt.  Marland Kitchen. RHINOPLASTY  201  . SCAR REVISION Bilateral 08/10/2015   Procedure: SCAR REVISION LIP;  Surgeon: Louisa SecondGerald Truesdale, MD;  Location: [facility];  Service: Plastics;  Laterality: Bilateral;  . SEPTOPLASTY Bilateral 08/10/2015   Procedure: RECONSTRUCTION RHINO SEPTOPLASTY CARTILAGE GRAFT REVISED BILATERAL INFERIOR TURBINaTES SCAR REVISION LIP;  Surgeon: Louisa SecondGerald Truesdale, MD;  Location: [facility];  Service: Plastics;  Laterality: Bilateral;        Home Medications    Prior to Admission medications   Medication Sig Start Date End Date Taking? Authorizing Provider  dicyclomine (BENTYL) 20 MG tablet Take 1 tablet (20 mg total) by mouth 2 (two) times daily. Patient not taking: Reported on 10/11/2017 01/24/17   Audry PiliMohr, [place], PA-C  Multiple  Vitamins-Minerals (MULTIVITAMIN WITH MINERALS) tablet Take 1 tablet by mouth daily.    [provider]  Omega-3 Fatty Acids (FISH OIL) 1000 MG CPDR Take 1,000 mg by mouth daily.    [provider]  ondansetron (ZOFRAN ODT) 4 MG disintegrating tablet Take 1 tablet (4 mg total) by mouth every 8 (eight) hours as needed for nausea or vomiting. 10/11/17   Street, [place], PA-C  OVER THE COUNTER MEDICATION Take 1 tablet by mouth daily. IBgard    [provider]  pantoprazole (PROTONIX) 20 MG tablet Take 1 tablet (20 mg total) by mouth daily. Patient not taking: Reported on 10/11/2017 01/24/17   Audry PiliMohr, [place], PA-C  ranitidine (ZANTAC) 150 MG tablet Take 1 tablet (150 mg total) by mouth 2 (two) times daily. 10/11/17   Street, [place], PA-C    Family History No family history on file.  Social History Social History   Tobacco Use  . Smoking status: Never Smoker  . Smokeless tobacco: Never Used  Substance Use Topics  . Alcohol use: Yes    Comment: occasionally  . Drug use: No     Allergies   Patient has no known allergies.   Review of Systems Review of Systems  Constitutional: Negative for chills and fever.  Respiratory: Negative for shortness of breath.   Cardiovascular: Negative for chest pain.  Gastrointestinal: Positive for abdominal pain, constipation and nausea. Negative for abdominal distention, blood in stool, diarrhea and vomiting.  Genitourinary: Positive for flank pain.  Negative for difficulty urinating, dysuria, frequency, hematuria and urgency.  Musculoskeletal: Negative for myalgias.  Skin: Negative for rash and wound.  Allergic/Immunologic: Negative for immunocompromised state.  Psychiatric/Behavioral: Negative for confusion.     Physical Exam Updated Vital Signs BP 117/65 (BP Location: Right Arm)   Pulse (!) 58   Temp 98.3 F (36.8 C) (Oral)   Resp 15   Wt 89.8 kg   SpO2 99%   BMI 27.62 kg/m   Physical Exam  Constitutional: He is  oriented to person, place, and time. He appears well-developed and well-nourished. No distress.  HENT:  Head: Normocephalic and atraumatic.  Cardiovascular: Normal rate, regular rhythm and intact distal pulses.  Pulmonary/Chest: Effort normal and breath sounds normal. He exhibits bony tenderness.    Abdominal: Normal appearance and bowel sounds are normal. There is tenderness in the right upper quadrant. There is no CVA tenderness.  Neurological: He is alert and oriented to person, place, and time.  Skin: Skin is warm and dry. He is not diaphoretic.  Psychiatric: He has a normal mood and affect. His behavior is normal.  Nursing note and vitals reviewed.    ED Treatments / Results  Labs (all labs ordered are listed, but only abnormal results are displayed) Labs Reviewed  COMPREHENSIVE METABOLIC PANEL - Abnormal; Notable for the following components:      Result Value   CO2 21 (*)    Glucose, Bld 110 (*)    All other components within normal limits  CBC WITH DIFFERENTIAL/PLATELET  LIPASE, BLOOD  URINALYSIS, ROUTINE W REFLEX MICROSCOPIC    EKG EKG Interpretation  Date/Time:  Saturday July 14 2018 08:17:28 EST Ventricular Rate:  61 PR Interval:    QRS Duration: 97 QT Interval:  431 QTC Calculation: 427 R Axis:   36 Text Interpretation:  Sinus rhythm Baseline wander in lead(s) V1 Confirmed by Vanetta Mulders [id number]) on 07/14/2018 8:22:03 AM Also confirmed by Vanetta Mulders [phone]), editor Sheppard Evens ([id number])  on 07/14/2018 9:54:41 AM   Radiology Ct Abdomen Pelvis W Contrast  Result Date: 07/14/2018 CLINICAL DATA:  RIGHT-side abdominal pain with nausea since Monday, worsened pain today, history irritable bowel syndrome EXAM: CT ABDOMEN AND PELVIS WITH CONTRAST TECHNIQUE: Multidetector CT imaging of the abdomen and pelvis was performed using the standard protocol following bolus administration of intravenous contrast. Sagittal and coronal MPR images reconstructed  from axial data set. CONTRAST:  OMNIPAQUE IOHEXOL 300 MG/ML SOLN IV. No oral contrast. COMPARISON:  None. FINDINGS: Lower chest: Lung bases clear Hepatobiliary: Fatty infiltration of liver. Liver and gallbladder otherwise normal appearance Pancreas: Normal appearance Spleen: Normal appearance Adrenals/Urinary Tract: Adrenal glands, kidneys, ureters, and bladder normal appearance Stomach/Bowel: Normal appearing retrocecal appendix. Stomach and bowel loops normal appearance for technique. Vascular/Lymphatic: Aorta normal caliber. Vascular structures patent with minimal atherosclerotic calcification at the iliac arteries. No adenopathy. Reproductive: No prostatic or seminal vesicle abnormalities. Other: No free air or free fluid. No hernia or acute inflammatory process. Musculoskeletal: Unremarkable IMPRESSION: Fatty infiltration of liver. Otherwise normal exam. Electronically Signed   By: Ulyses Southward M.D.   On: 07/14/2018 10:58    Procedures Procedures (including critical care time)  Medications Ordered in ED Medications  iohexol (OMNIPAQUE) 300 MG/ML solution 100 mL (100 mLs Intravenous Contrast Given 07/14/18 1038)     Initial Impression / Assessment and Plan / ED Course  I have reviewed the triage vital signs and the nursing notes.  Pertinent labs & imaging results that were available during my care of  the patient were reviewed by me and considered in my medical decision making (see chart for details).  Clinical Course as of Jul 14 1224  Sat Jul 14, 2018  [id number] 42 year old male with complaint of right flank pain.  Patient reports his pain is chronic in nature however was worse this morning when awoken from his sleep.  She was previously seen in this emergency room in February for similar symptoms, had an ultrasound showing fatty liver and followed up with GI.  Patient was unable to have endoscopy at that time due to insurance changes however his endoscopy is currently scheduled for January.   On exam patient has tenderness in his right upper quadrant and right flank area with bony tenderness over his right lower mid axillary ribs without crepitus or ecchymosis.  Lab work including a urinalysis, lipase, CBC, CMP without significant findings, vital signs are stable.  CT of the abdomen and pelvis with contrast shows fatty liver, no other acute findings.  Discussed results with patient who states he is feeling better at this time.  Advised patient to follow fatty liver diet, avoid alcohol and Tylenol and follow-up with his GI and PCP.  Return to the emergency room for any new or concerning symptoms.  Patient verbalizes understanding of his discharge instructions and plan.   [LM]    Clinical Course User Index [LM] Jeannie Fend, PA-C   Final Clinical Impressions(s) / ED Diagnoses   Final diagnoses:  Right upper quadrant abdominal pain  Fatty liver    ED Discharge Orders    None       Jeannie Fend, PA-C 07/14/18 1225    Vanetta Mulders, MD 07/15/18 [phone]

## 2018-07-14 NOTE — Discharge Instructions (Addendum)
Follow up with your doctor next week. Return to the ER for any new or worsening symptoms.  See instructions for fatty liver diet.

## 2018-07-14 NOTE — ED Notes (Signed)
Pt in hall bed- states is comfortable at this time

## 2018-08-20 ENCOUNTER — Other Ambulatory Visit: Payer: Self-pay

## 2020-05-17 IMAGING — CT CT ABD-PELV W/ CM
2 of 5 series · 16 of 46 positions shown, 18 images · IV contrast (omnipaque)
Comparison: None.

CLINICAL DATA: RIGHT-side abdominal pain with nausea since [REDACTED],
worsened pain today, history irritable bowel syndrome

EXAM:
CT ABDOMEN AND PELVIS WITH CONTRAST
TECHNIQUE: Multidetector CT imaging of the abdomen and pelvis was performed
using the standard protocol following bolus administration of
intravenous contrast. Sagittal and coronal MPR images reconstructed
from axial data set.
CONTRAST:  100mL OMNIPAQUE IOHEXOL 300 MG/ML SOLN IV. No oral
contrast.

[Series 3: abdomen 5.0 · axial · 0.86mm/px · z∈[+874,+1284]mm · 13 of 96 slices shown, 15 images]
[im 7/96  soft-tissue]
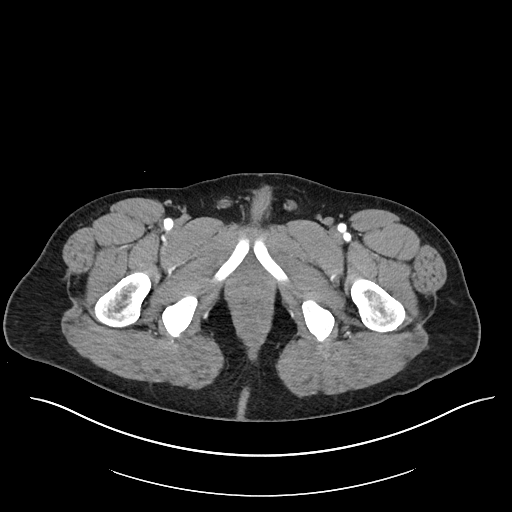
[im 7/96  bone]
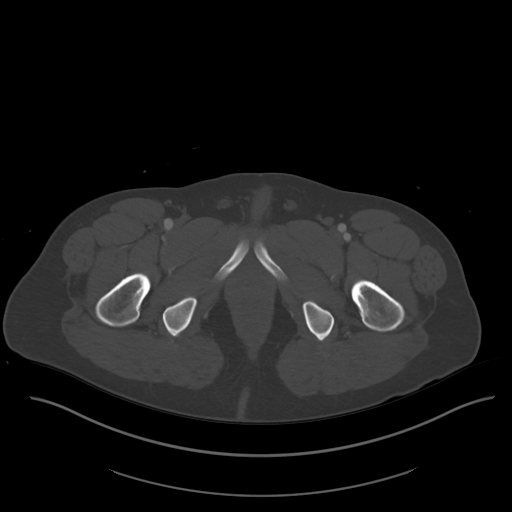
[im 14/96  soft-tissue]
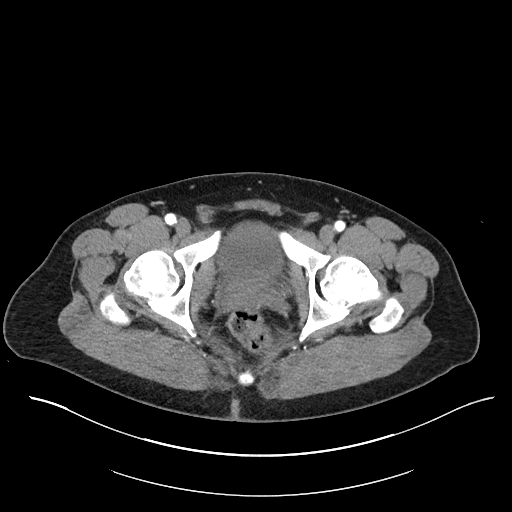
[im 21/96  soft-tissue]
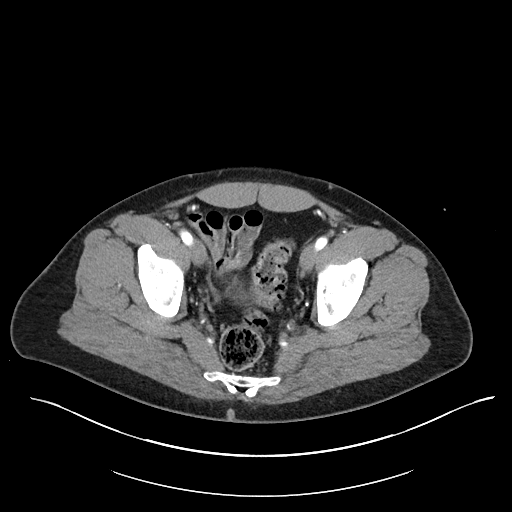
[im 28/96  soft-tissue]
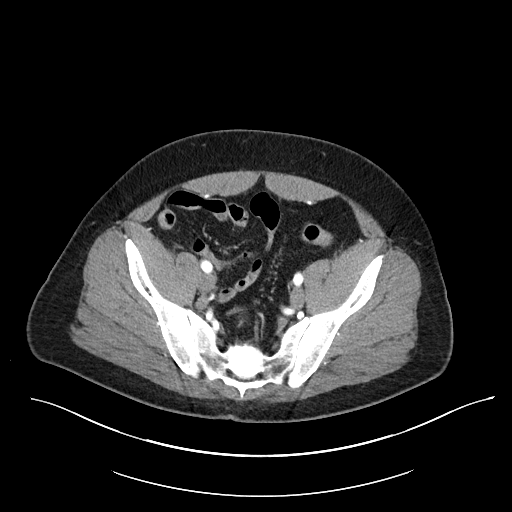
[im 34/96  soft-tissue]
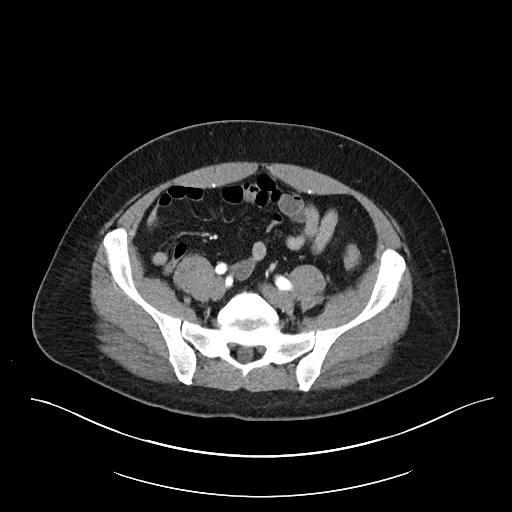
[im 41/96  soft-tissue]
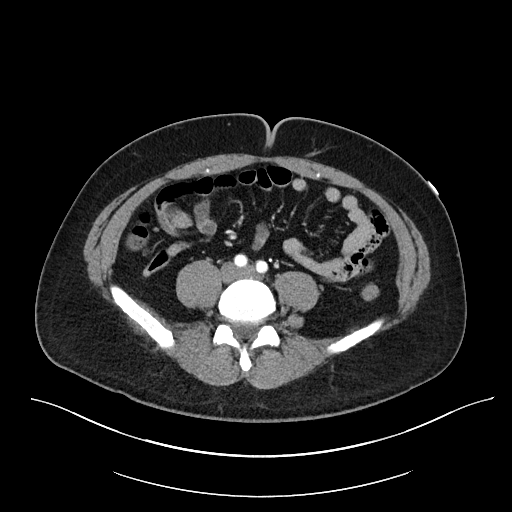
[im 48/96  soft-tissue]
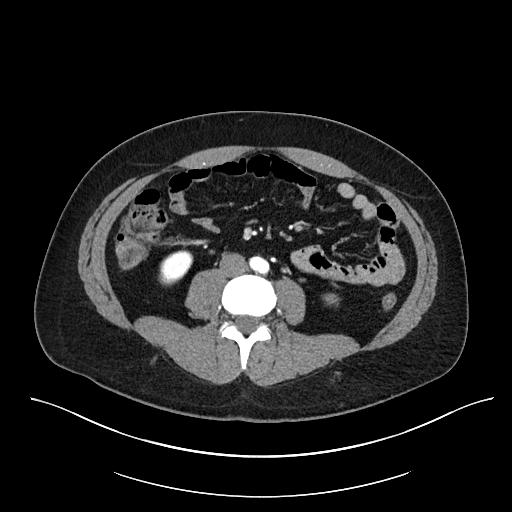
[im 55/96  soft-tissue]
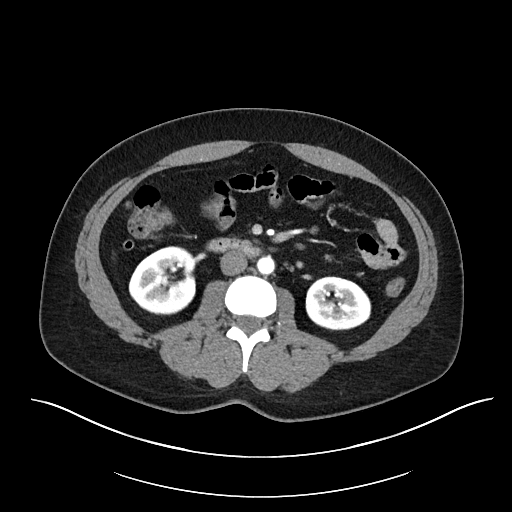
[im 62/96  soft-tissue]
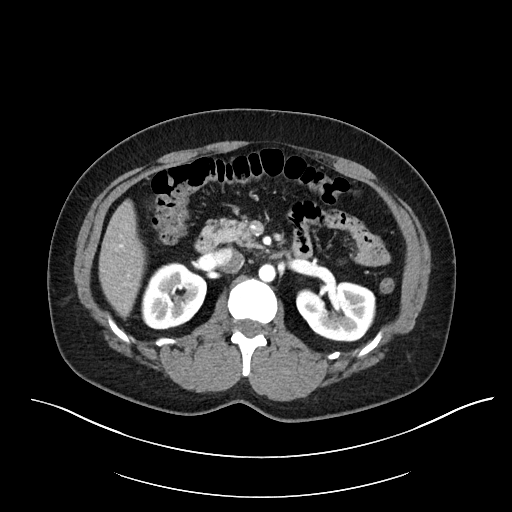
[im 62/96  bone]
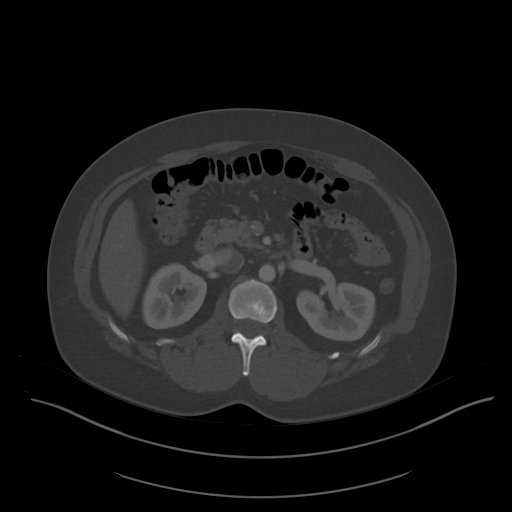
[im 68/96  soft-tissue]
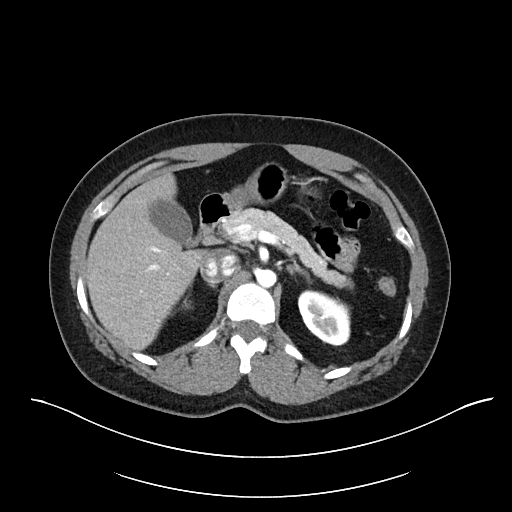
[im 75/96  soft-tissue]
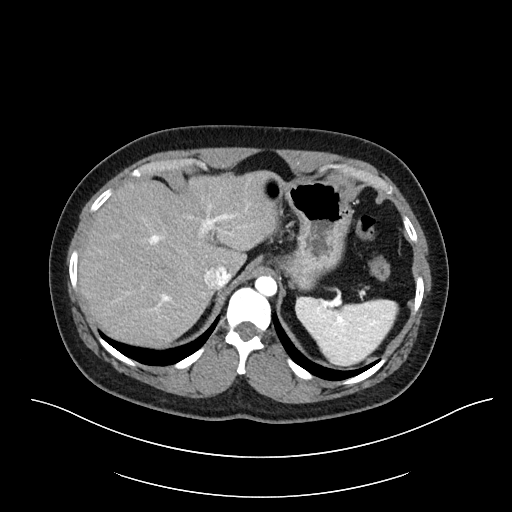
[im 82/96  soft-tissue]
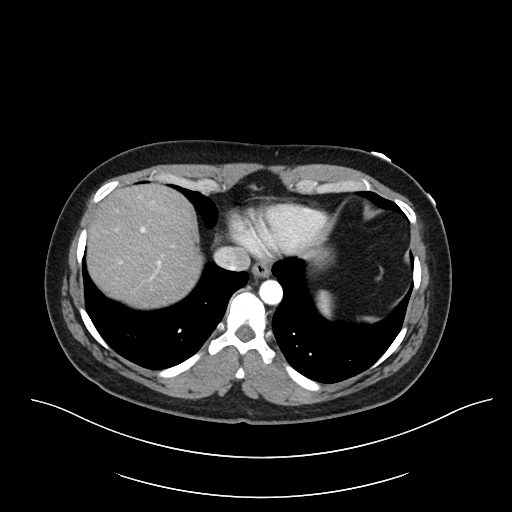
[im 89/96  soft-tissue]
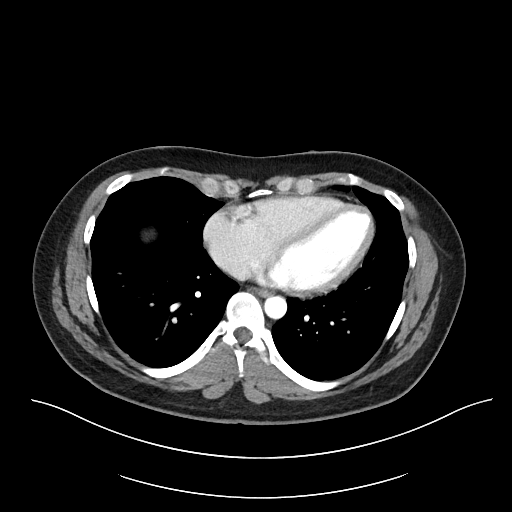

[Series 6: abdomen 3.0 mpr cor · coronal · 0.72mm/px · 3 of 77 slices shown]
[im 26/77  soft-tissue]
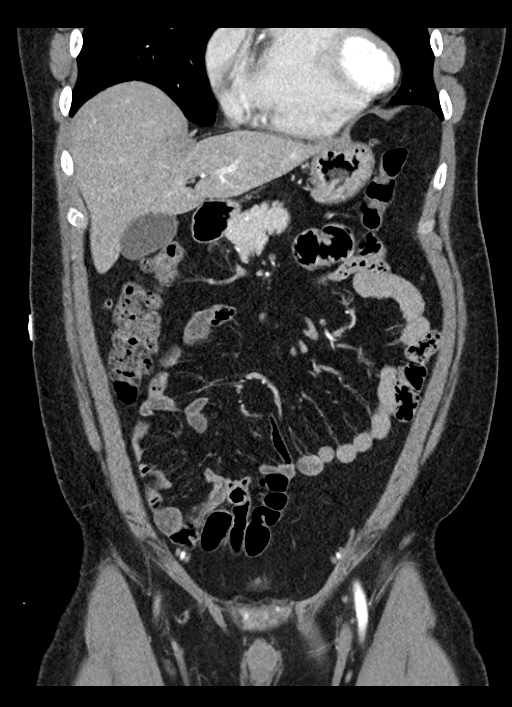
[im 34/77  soft-tissue]
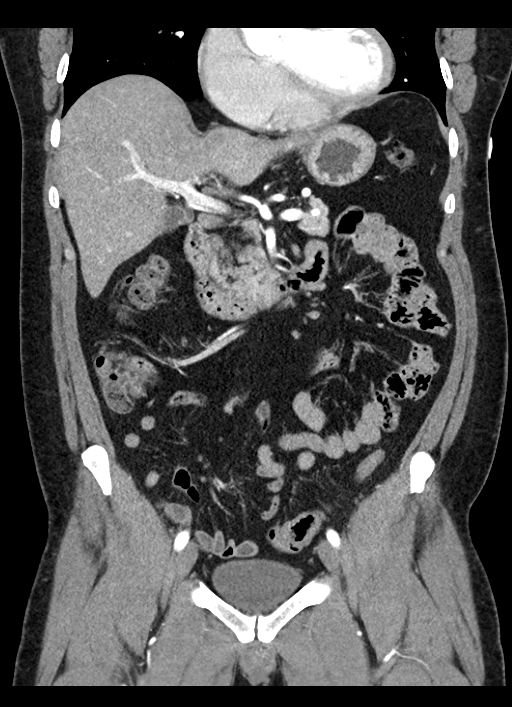
[im 43/77  soft-tissue]
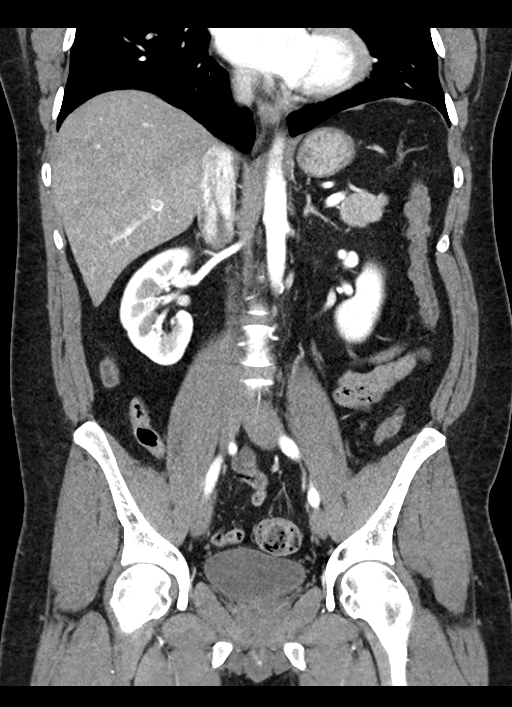

[16 of 46 positions shown; findings below may reference images not displayed]

FINDINGS: Lower chest: Lung bases clear

Hepatobiliary: Fatty infiltration of liver. Liver and gallbladder
otherwise normal appearance

Pancreas: Normal appearance

Spleen: Normal appearance

Adrenals/Urinary Tract: Adrenal glands, kidneys, ureters, and
bladder normal appearance

Stomach/Bowel: Normal appearing retrocecal appendix. Stomach and
bowel loops normal appearance for technique.

Vascular/Lymphatic: Aorta normal caliber. Vascular structures patent
with minimal atherosclerotic calcification at the iliac arteries. No
adenopathy.

Reproductive: No prostatic or seminal vesicle abnormalities.

Other: No free air or free fluid. No hernia or acute inflammatory
process.

Musculoskeletal: Unremarkable
IMPRESSION: Fatty infiltration of liver.

Otherwise normal exam.
